# Patient Record
Sex: Male | Born: 1935 | Race: White | Hispanic: No | Smoking: Never smoker
Health system: Southern US, Community
[De-identification: ages and names within clinical notes are randomized; demographics above are authoritative.]

## PROBLEM LIST (undated history)

## (undated) DIAGNOSIS — E119 Type 2 diabetes mellitus without complications: Secondary | ICD-10-CM

## (undated) DIAGNOSIS — I10 Essential (primary) hypertension: Secondary | ICD-10-CM

## (undated) DIAGNOSIS — I639 Cerebral infarction, unspecified: Secondary | ICD-10-CM

## (undated) HISTORY — PX: LARYNGOSCOPY: SUR817

---

## 2001-09-07 ENCOUNTER — Emergency Department (HOSPITAL_COMMUNITY): Admission: EM | Admit: 2001-09-07 | Discharge: 2001-09-07 | Payer: Self-pay | Admitting: Emergency Medicine

## 2001-09-07 ENCOUNTER — Encounter: Payer: Self-pay | Admitting: Emergency Medicine

## 2003-01-13 ENCOUNTER — Encounter: Payer: Self-pay | Admitting: Emergency Medicine

## 2003-01-13 ENCOUNTER — Emergency Department (HOSPITAL_COMMUNITY): Admission: EM | Admit: 2003-01-13 | Discharge: 2003-01-13 | Payer: Self-pay | Admitting: Emergency Medicine

## 2009-02-17 ENCOUNTER — Inpatient Hospital Stay (HOSPITAL_COMMUNITY): Admission: EM | Admit: 2009-02-17 | Discharge: 2009-02-20 | Payer: Self-pay | Admitting: Emergency Medicine

## 2009-02-17 ENCOUNTER — Ambulatory Visit: Payer: Self-pay | Admitting: Cardiology

## 2009-02-18 ENCOUNTER — Encounter (INDEPENDENT_AMBULATORY_CARE_PROVIDER_SITE_OTHER): Payer: Self-pay | Admitting: Internal Medicine

## 2009-02-19 ENCOUNTER — Ambulatory Visit: Payer: Self-pay | Admitting: Physical Medicine & Rehabilitation

## 2009-02-19 ENCOUNTER — Encounter (INDEPENDENT_AMBULATORY_CARE_PROVIDER_SITE_OTHER): Payer: Self-pay | Admitting: Internal Medicine

## 2010-11-14 LAB — URINALYSIS, MICROSCOPIC ONLY
Glucose, UA: NEGATIVE mg/dL
Leukocytes, UA: NEGATIVE
Nitrite: NEGATIVE
Protein, ur: 100 mg/dL — AB
pH: 5.5 (ref 5.0–8.0)

## 2010-11-14 LAB — COMPREHENSIVE METABOLIC PANEL
Albumin: 3.6 g/dL (ref 3.5–5.2)
BUN: 15 mg/dL (ref 6–23)
Creatinine, Ser: 1.54 mg/dL — ABNORMAL HIGH (ref 0.4–1.5)
Potassium: 4.3 mEq/L (ref 3.5–5.1)
Total Protein: 6.8 g/dL (ref 6.0–8.3)

## 2010-11-14 LAB — LIPID PANEL
Cholesterol: 181 mg/dL (ref 0–200)
HDL: 30 mg/dL — ABNORMAL LOW (ref >39)
LDL Cholesterol: 135 mg/dL — ABNORMAL HIGH (ref 0–99)
Total CHOL/HDL Ratio: 6 RATIO

## 2010-11-14 LAB — BASIC METABOLIC PANEL
CO2: 29 mEq/L (ref 19–32)
Calcium: 8.7 mg/dL (ref 8.4–10.5)
Chloride: 106 mEq/L (ref 96–112)
Glucose, Bld: 114 mg/dL — ABNORMAL HIGH (ref 70–99)
Sodium: 140 mEq/L (ref 135–145)

## 2010-11-14 LAB — CBC
HCT: 47.9 % (ref 39.0–52.0)
HCT: 49.4 % (ref 39.0–52.0)
HCT: 51.3 % (ref 39.0–52.0)
Hemoglobin: 16.8 g/dL (ref 13.0–17.0)
MCHC: 34 g/dL (ref 30.0–36.0)
MCHC: 34.1 g/dL (ref 30.0–36.0)
MCV: 91.6 fL (ref 78.0–100.0)
MCV: 92 fL (ref 78.0–100.0)
Platelets: 104 10*3/uL — ABNORMAL LOW (ref 150–400)
Platelets: 109 10*3/uL — ABNORMAL LOW (ref 150–400)
RBC: 5.37 MIL/uL (ref 4.22–5.81)
RDW: 13.7 % (ref 11.5–15.5)
RDW: 13.9 % (ref 11.5–15.5)
RDW: 13.9 % (ref 11.5–15.5)
WBC: 8.5 10*3/uL (ref 4.0–10.5)

## 2010-11-14 LAB — URINE CULTURE: Colony Count: 40000

## 2010-11-14 LAB — DIFFERENTIAL
Eosinophils Absolute: 0.3 10*3/uL (ref 0.0–0.7)
Eosinophils Relative: 4 % (ref 0–5)
Lymphocytes Relative: 13 % (ref 12–46)
Lymphs Abs: 1.1 10*3/uL (ref 0.7–4.0)
Lymphs Abs: 1.9 10*3/uL (ref 0.7–4.0)
Monocytes Absolute: 0.8 10*3/uL (ref 0.1–1.0)
Monocytes Relative: 9 % (ref 3–12)
Neutro Abs: 6.6 10*3/uL (ref 1.7–7.7)

## 2010-11-14 LAB — APTT: aPTT: 32 seconds (ref 24–37)

## 2010-11-14 LAB — CK TOTAL AND CKMB (NOT AT ARMC)
Relative Index: 1.2 (ref 0.0–2.5)
Total CK: 205 U/L (ref 7–232)

## 2010-11-14 LAB — ETHANOL: Alcohol, Ethyl (B): <5 mg/dL (ref 0–10)

## 2010-11-14 LAB — TROPONIN I: Troponin I: 0.03 ng/mL (ref 0.00–0.06)

## 2010-12-21 NOTE — H&P (Signed)
Alex Ruiz, Alex Ruiz NO.:  [id number]   MEDICAL RECORD NO.:  [id number]          PATIENT TYPE:  INP   LOCATION:  3701                         FACILITY:  [facility]   PHYSICIAN:  Oswald Hillock, MD        DATE OF BIRTH:  1935/08/13   DATE OF ADMISSION:  02/17/2009  DATE OF DISCHARGE:                              HISTORY & PHYSICAL   CHIEF COMPLAINT:  Unsteady gait.   HISTORY OF PRESENT ILLNESS:  The patient is a 75 year old African  American male, assisted-living resident, who presents to the emergency  room with a 1-day history of unsteady gait and swaying to the left side.  Apparently, the patient has history of expressive aphasia since stroke  way back in late 44s.  He is currently living in assisted living here in  [place] and states that at baseline, he uses a cane only sparingly.  He has had a history of falls and the person accompanying him states  clearly that he was swaying to the left side and not his usual self.  The patient denies any chest pain, any shortness of breath, any  palpitations, any loss of consciousness, or any new weakness of any part  of the body.  No cough, fever, rigors reported by the patient either.   Past medical history is significant for:  1. Hypertension.  2. CVA with expressive aphasia.  3. Gout.   PAST SURGICAL HISTORY:  None.   CURRENT MEDICATIONS:  Aspirin, lisinopril, Norvasc.   ALLERGIES:  No known drug allergies.   SOCIAL HISTORY:  The patient has no recent history of alcohol, tobacco,  or drug use.  Lives in assisted living.  Needs help with some of his  ADLs and states that prior to this, he was able to ambulate without  help.   FAMILY HISTORY:  History of hypertension in the family.   REVIEW OF SYSTEMS:  An extensive review of systems was done and all  systems are negative except for the positive mentioned in the history of  present illness.   PHYSICAL EXAMINATION:  VITAL SIGNS:  On admission, pulse 96, blood  pressure 192/103, respiratory rate of 16, temperature 97.7, O2 sats of  99% on 2 liters nasal cannula.  GENERAL:  The patient is conscious, in no acute distress at the time of  this interview, and has significant expressive aphasia.  HEENT:  No  scleral icterus, no pallor.  Ears, negative.  Poor dental hygiene.  NECK:  Supple.  No lymphadenopathy, no JVD, no carotid bruit.  CHEST:  Breath sounds heard bilaterally.  No added sounds.  CVS:  S1 and S2 plus regular.  No gallop or rub.  ABDOMEN:  Soft, nontender.  Bowel sounds present.  EXTREMITIES:  No cyanosis, clubbing, or edema.  NEUROLOGIC:  The patient as mentioned above has expressive aphasia;  otherwise, no apparent cranial palsy.  Motor examination, power grade  5/5 all over, normal reflexes.  Plantars are bilaterally downgoing.  The  patient has difficulty following the finger-nose and heel-shin testing  and has significant ataxia.   LABORATORY DATA:  His CT  head revealed no acute intracranial findings,  left temporoparietal encephalomalacia changes and atrophy.  Chest x-ray  showed questionable irregular nodular opacity medially at the right lung  base.  No definite infiltrate or effusion.  Alcohol level was less than  5.  Sodium 141, potassium 4.3, chloride 106, CO2 of 28, glucose 111, BUN  15, creatinine 1.54.  His total bilirubin was 1.3.  PT was 15.2, INR  1.2.  WBC count 8.6, hemoglobin 17.4, hematocrit 51.3, platelet count of  109.   IMPRESSION AND PLAN:  This is a case of a 75 year old African American  male with known history of hypertension, cerebrovascular accident with  expressive aphasia and gout who presents with new symptoms of unsteady  gait.  1. Unsteady gait, need to rule out new cerebrovascular accident.  We      will get an MRI of his brain and follow up with the results.  I      will also initiate a new stroke workup, as the patient has been      worked up in the last few years.  His last CVA was about 9  years      back.  This will include baseline ESR, CRP, homocysteine level,      fasting lipid panel, hemoglobin A1c, and TSH, bilateral carotid and      transcranial Dopplers.  We will also get a baseline EKG and set of      cardiac enzymes and follow up with the results.  2. Hypertension.  The patient's blood pressure was elevated on      admission, it has improved significantly.  For now, we will      continue him on his current medications.  Important to mention that      the patient will be continued on his daily dose of aspirin.  3. Gout.  No medications documented.  We will check a uric acid level      and monitor.  4. Nodular opacity on chest x-ray.  We do not have any prior chest x-      rays available.  We will get a regular CT of the chest and follow      up with the results.  5. Deep venous thrombosis/gastrointestinal prophylaxis.  Protonix and      subcu heparin.      Oswald Hillock, MD  Electronically Signed     BA/MEDQ  D:  02/17/2009  T:  02/18/2009  Job:  [id number]   cc:   at the assisted living Primary care physician

## 2010-12-21 NOTE — Discharge Summary (Signed)
NAMEENDY, EASTERLY NO.:  [id number]   MEDICAL RECORD NO.:  [id number]          PATIENT TYPE:  INP   LOCATION:  3701                         FACILITY:  [facility]   PHYSICIAN:  Elliot Cousin, M.D.    DATE OF BIRTH:  29-Oct-1935   DATE OF ADMISSION:  02/17/2009  DATE OF DISCHARGE:  02/20/2009                               DISCHARGE SUMMARY   DISCHARGE DIAGNOSES:  1. Acute left brain stroke (small area of acute infarction in the left      medial occipital lobe per MRI of the brain on February 18, 2009).  2. Unsteady gait/gait disorder and mild right-sided hemiparesis      secondary to the acute stroke.  3. History of previous left brain stroke with chronic expressive      aphasia.  4. Malignant hypertension.  5. Acute renal insufficiency, resolved with IV fluids.  6. Thrombocytopenia, etiology unclear.  However, the patient's      platelet count remained stable ranging from 100-110.  Vitamin B12      and TSH was were both within normal limits.  7. Hyperlipidemia.   DISCHARGE MEDICATIONS:  1. Norvasc 10 mg daily.  2. Plavix 75 mg daily.  3. Lisinopril 10 mg daily.  4. Metoprolol 25 mg b.i.d.  5. Protonix 40 mg daily.  6. Zocor 10 mg every night.   DISCHARGE DISPOSITION:  The patient is currently stable and in improved  condition.  The plan is to discharge him to [facility] skilled nursing  facility today.   CONSULTATIONS:  Rehabilitation physician, Dr. Wynn Banker.   PROCEDURE PERFORMED:  1. Carotid duplex study on February 19, 2009.  The results revealed no ICA      stenosis.  Vertebral flow antegrade.  2. Transcranial Doppler.  Results are currently pending.  3. CT scan of the chest without contrast on February 18, 2009.  The      results revealed no evidence of pulmonary nodules.  Low-lung      volumes.  4. MRI of the brain with and without contrast on February 18, 2009.  The      results revealed small area of acute infarction in the left medial      occipital lobe.   Advanced atrophy and advanced chronic ischemic      changes.  Encephalomalacia in the left temporal lobe related to      prior craniotomy.  Multiple areas of chronic hemorrhage, most      likely due to chronic hypertension.  5. Two-D echocardiogram on February 18, 2009.  The results revealed a      limited exam.  Left ventricle cavity size was normal.  Images were      inadequate for LV for motion assessment.  Right ventricle function      is probably normal,  as interpreted by cardiologist, Dr. Willa Rough.   HISTORY OF PRESENT ILLNESS:  The patient is a 75 year old man with a  medical history significant for a previous left brain stroke, chronic  expressive aphasia, hypertension, and gout, who presented to the  emergency department on  February 17, 2009, with a chief complaint of an  unsteady gait.  When the patient was evaluated in the emergency  department, he was noted to be hypertensive with a blood pressure  192/103.  His respiratory rate and oxygen saturations were within normal  limits.  The CT scan of the head as ordered by the emergency department  physician revealed no acute intracranial findings.  However, there was  evidence of a left temporal parietal encephalomalacia and atrophy.  His  chest x-ray revealed a questionable irregular nodular opacity at the  right lung base.  However, there were no definite infiltrates or  effusions.  His EKG revealed normal sinus rhythm with a heart rate of 92  beats per minute and no ST or T-wave abnormalities.  The patient was  admitted for further evaluation and management.   For additional details, please see the dictated history and physical.   HOSPITAL COURSE:  1. Acute left brain stroke, history of prior left brain stroke, right-      sided hemiparesis, chronic expressive aphasia, and hyperlipidemia.      The patient had been treated with aspirin prior to this      hospitalization.  Apparently, aspirin was continued at the time of       the initial hospital assessment, pending further evaluation for a      new CVA.  Both Norvasc and lisinopril were continued for blood      pressure control.  His swallowing was evaluated in the emergency      department and he passed.  For further evaluation, a number of      studies were ordered including an MRI of the brain, carotid Doppler      study, transcranial Dopplers, 2-D echocardiogram, and a number of      laboratory studies.  The results of the MRI of the brain are above.      However, in essence, it did reveal an acute infarction in the left      medial occipital lobe.  The results of the transcranial Dopplers      are pending.  The carotid Doppler duplex study revealed no ICA      stenosis.  The results of the 2-D echocardiogram were limited.      However, it appeared that the patient's left ventricular function      and right ventricular functions were within normal limits; there      was no obvious evidence of a cardiac embolic source.  His cardiac      enzymes were all within normal limits.  His TSH was within normal      limits at 0.645.  His sed rate was 3.  C-reactive protein was      actually low at 0.5.  His blood homocysteine level was within      normal limits at 10.9.  His fasting lipid profile revealed a total      cholesterol of 181, HDL cholesterol of 30, LDL cholesterol of 135,      and triglycerides of 82.  The patient was started on a small dose      of Zocor daily.  His hemoglobin A1c was within normal limits at      5.2.  His vitamin B12 was within normal limits at 510.  RPR was      nonreactive.  His urinalysis was free of WBCs.  The patient was      evaluated by the physical therapist.  She recommended further  strengthening in a skilled environment or at the inpatient      rehabilitation unit at [facility].  Rehabilitation      physician Dr. Wynn Banker was consulted, and at the time of his      assessment, it was unclear whether or not the  patient had family      members who could stay with him 24 hours per day following a      potential discharge from the inpatient rehabilitation unit.  When      it was confirmed that he would not have 24-hour assistance at home,      skilled nursing facility placement was pursued.  The patient has      acquired a bed at [facility] today.  He is medically stable for      hospital discharge.  In light of the new acute stroke and evidence      of chronic microhemorrhages, the aspirin was discontinued, and the      patient was started on Plavix instead.  2. Malignant hypertension.  The patient was restarted on Norvasc and      lisinopril.  His blood pressure remained elevated.  There was      caution taken in decreasing his blood pressure too rapidly in the      setting of an acute stroke.  However, metoprolol 25 mg b.i.d. was      added.  Over the past 24 hours, his systolic blood pressure has      been ranging in the 160s, and his diastolic blood pressure has been      ranging in the 80s to 90s.  3. Acute renal insufficiency.  At the time of the initial hospital      assessment, the patient's creatinine was 1.54.  He was started on      gentle IV fluids.  Prior to hospital discharge, his creatinine      improved to 1.10.  4. Thrombocytopenia.  At the time of the initial hospital assessment,      the patient's platelet count was 109.  Prior to hospital discharge,      it was 104.  The etiology of his thrombocytopenia is unknown.      However, it could be secondary to antiplatelet therapy.  There was      no evidence of acute bleeding during the hospitalization.  As      above, for evaluation, vitamin B12 and TSH were ordered.  Both were      within normal limits.  His platelet count      will need to continue to be monitored on ongoing antiplatelet      therapy.  5. Reported questionable nodular opacities seen on chest x-ray.      Because of this questionable finding on the chest x-ray, a  CT scan      of the chest was ordered for evaluation.  The CT scan revealed no      pulmonary nodules.      Elliot Cousin, M.D.  Electronically Signed     DF/MEDQ  D:  02/20/2009  T:  02/20/2009  Job:  [id number]

## 2014-04-28 ENCOUNTER — Emergency Department (HOSPITAL_COMMUNITY)
Admission: EM | Admit: 2014-04-28 | Discharge: 2014-04-28 | Disposition: A | Discharge status: Home / Self Care | Payer: Medicare Other | Attending: Emergency Medicine | Admitting: Emergency Medicine

## 2014-04-28 ENCOUNTER — Emergency Department (HOSPITAL_COMMUNITY): Payer: Medicare Other

## 2014-04-28 ENCOUNTER — Encounter (HOSPITAL_COMMUNITY): Payer: Self-pay | Admitting: Emergency Medicine

## 2014-04-28 DIAGNOSIS — F0390 Unspecified dementia without behavioral disturbance: Secondary | ICD-10-CM | POA: Insufficient documentation

## 2014-04-28 DIAGNOSIS — Y9389 Activity, other specified: Secondary | ICD-10-CM | POA: Insufficient documentation

## 2014-04-28 DIAGNOSIS — E119 Type 2 diabetes mellitus without complications: Secondary | ICD-10-CM | POA: Insufficient documentation

## 2014-04-28 DIAGNOSIS — E669 Obesity, unspecified: Secondary | ICD-10-CM | POA: Diagnosis not present

## 2014-04-28 DIAGNOSIS — Z794 Long term (current) use of insulin: Secondary | ICD-10-CM | POA: Insufficient documentation

## 2014-04-28 DIAGNOSIS — Z7902 Long term (current) use of antithrombotics/antiplatelets: Secondary | ICD-10-CM | POA: Insufficient documentation

## 2014-04-28 DIAGNOSIS — W19XXXA Unspecified fall, initial encounter: Secondary | ICD-10-CM

## 2014-04-28 DIAGNOSIS — Z79899 Other long term (current) drug therapy: Secondary | ICD-10-CM | POA: Insufficient documentation

## 2014-04-28 DIAGNOSIS — S0990XA Unspecified injury of head, initial encounter: Secondary | ICD-10-CM | POA: Diagnosis present

## 2014-04-28 DIAGNOSIS — IMO0002 Reserved for concepts with insufficient information to code with codable children: Secondary | ICD-10-CM | POA: Diagnosis not present

## 2014-04-28 DIAGNOSIS — Z8673 Personal history of transient ischemic attack (TIA), and cerebral infarction without residual deficits: Secondary | ICD-10-CM | POA: Diagnosis not present

## 2014-04-28 DIAGNOSIS — W1809XA Striking against other object with subsequent fall, initial encounter: Secondary | ICD-10-CM | POA: Diagnosis not present

## 2014-04-28 DIAGNOSIS — I10 Essential (primary) hypertension: Secondary | ICD-10-CM | POA: Diagnosis not present

## 2014-04-28 DIAGNOSIS — Y9289 Other specified places as the place of occurrence of the external cause: Secondary | ICD-10-CM | POA: Insufficient documentation

## 2014-04-28 DIAGNOSIS — T148XXA Other injury of unspecified body region, initial encounter: Secondary | ICD-10-CM

## 2014-04-28 HISTORY — DX: Cerebral infarction, unspecified: I63.9

## 2014-04-28 HISTORY — DX: Type 2 diabetes mellitus without complications: E11.9

## 2014-04-28 HISTORY — DX: Essential (primary) hypertension: I10

## 2014-04-28 NOTE — ED Notes (Signed)
Pt placed into gown and on monitor upon arrival to room. Pt monitored by blood pressure, pulse ox, and 5 lead.  

## 2014-04-28 NOTE — ED Notes (Signed)
Pt remains off of monitor due to monitor causing agitation RN, Elon Jester made aware.

## 2014-04-28 NOTE — ED Notes (Signed)
PTAR at bedside 

## 2014-04-28 NOTE — ED Notes (Signed)
Pt remains monitored by blood pressure and pulse ox.  

## 2014-04-28 NOTE — ED Notes (Signed)
Fell from standing position onto carpeted floor witnessed by staff at nursing home no loc no co abrasion to forehead "normal acting' per report

## 2014-04-28 NOTE — Discharge Instructions (Signed)
Followup with primary care physician. Monitor patient for neurologic changes from his baseline due to the fact that patient has been taking Plavix. Patient's CT scan was negative for any acute injury.    Fall Prevention and Home Safety Falls cause injuries and can affect all age groups. It is possible to prevent falls.  HOW TO PREVENT FALLS  Wear shoes with rubber soles that do not have an opening for your toes.  Keep the inside and outside of your house well lit.  Use night lights throughout your home.  Remove clutter from floors.  Clean up floor spills.  Remove throw rugs or fasten them to the floor with carpet tape.  Do not place electrical cords across pathways.  Put grab bars by your tub, shower, and toilet. Do not use towel bars as grab bars.  Put handrails on both sides of the stairway. Fix loose handrails.  Do not climb on stools or stepladders, if possible.  Do not wax your floors.  Repair uneven or unsafe sidewalks, walkways, or stairs.  Keep items you use a lot within reach.  Be aware of pets.  Keep emergency numbers next to the telephone.  Put smoke detectors in your home and near bedrooms. Ask your doctor what other things you can do to prevent falls. Document Released: 05/21/2009 Document Revised: 01/24/2012 Document Reviewed: 10/25/2011 [facility] Patient Information 2015 [place], [place]. This information is not intended to replace advice given to you by your health care provider. Make sure you discuss any questions you have with your health care provider.   Abrasion An abrasion is a cut or scrape of the skin. Abrasions do not extend through all layers of the skin and most heal within 10 days. It is important to care for your abrasion properly to prevent infection. CAUSES  Most abrasions are caused by falling on, or gliding across, the ground or other surface. When your skin rubs on something, the outer and inner layer of skin rubs off, causing an  abrasion. DIAGNOSIS  Your caregiver will be able to diagnose an abrasion during a physical exam.  TREATMENT  Your treatment depends on how large and deep the abrasion is. Generally, your abrasion will be cleaned with water and a mild soap to remove any dirt or debris. An antibiotic ointment may be put over the abrasion to prevent an infection. A bandage (dressing) may be wrapped around the abrasion to keep it from getting dirty.  You may need a tetanus shot if:  You cannot remember when you had your last tetanus shot.  You have never had a tetanus shot.  The injury broke your skin. If you get a tetanus shot, your arm may swell, get red, and feel warm to the touch. This is common and not a problem. If you need a tetanus shot and you choose not to have one, there is a rare chance of getting tetanus. Sickness from tetanus can be serious.  HOME CARE INSTRUCTIONS   If a dressing was applied, change it at least once a day or as directed by your caregiver. If the bandage sticks, soak it off with warm water.   Wash the area with water and a mild soap to remove all the ointment 2 times a day. Rinse off the soap and pat the area dry with a clean towel.   Reapply any ointment as directed by your caregiver. This will help prevent infection and keep the bandage from sticking. Use gauze over the wound and under the  dressing to help keep the bandage from sticking.   Change your dressing right away if it becomes wet or dirty.   Only take over-the-counter or prescription medicines for pain, discomfort, or fever as directed by your caregiver.   Follow up with your caregiver within 24-48 hours for a wound check, or as directed. If you were not given a wound-check appointment, look closely at your abrasion for redness, swelling, or pus. These are signs of infection. SEEK IMMEDIATE MEDICAL CARE IF:   You have increasing pain in the wound.   You have redness, swelling, or tenderness around the wound.    You have pus coming from the wound.   You have a fever or persistent symptoms for more than 2-3 days.  You have a fever and your symptoms suddenly get worse.  You have a bad smell coming from the wound or dressing.  MAKE SURE YOU:   Understand these instructions.  Will watch your condition.  Will get help right away if you are not doing well or get worse. Document Released: 05/04/2005 Document Revised: 07/11/2012 Document Reviewed: 06/28/2011 [facility] Patient Information 2015 [place], [place]. This information is not intended to replace advice given to you by your health care provider. Make sure you discuss any questions you have with your health care provider.

## 2014-04-28 NOTE — ED Notes (Signed)
To ct

## 2014-04-28 NOTE — ED Notes (Signed)
This RN asked secretary French Ana to page PTAR again.

## 2014-04-28 NOTE — ED Notes (Signed)
Pt remians resting on cart in hallway at nurses station waiting for ptar pt without c/o

## 2014-04-28 NOTE — ED Provider Notes (Signed)
CSN: [id number]     Arrival date & time 04/28/14  1313 History   First MD Initiated Contact with Patient 04/28/14 1340     Chief Complaint  Patient presents with  . Fall     (Consider location/radiation/quality/duration/timing/severity/associated sxs/prior Treatment) HPI Mr. Alex Ruiz is a 78 year old male with past medical history of CVA, dementia who presents to the ER by EMS after a fall. Per nursing home staff patient was walking along a carpeted hallway, stumbled, then fell face forward hitting his head on the carpet. Nursing home staff denies loss of consciousness with patient, and states that he has been acting appropriately for his baseline mentally. Patient denies having any pain when I speak to him, and there is an obvious minor abrasion to the middle of his fore head. Patient able to make eye contact, obeys simple verbal commands, however cannot answer any questions appropriately.  Past Medical History  Diagnosis Date  . Hypertension   . Diabetes mellitus without complication   . Stroke    History reviewed. No pertinent past surgical history. History reviewed. No pertinent family history. History  Substance Use Topics  . Smoking status: Unknown If Ever Smoked  . Smokeless tobacco: Not on file  . Alcohol Use: No    Review of Systems  Unable to perform ROS: Dementia      Allergies  Review of patient's allergies indicates no known allergies.  Home Medications   Prior to Admission medications   Medication Sig Start Date End Date Taking? Authorizing Provider  amLODipine (NORVASC) 10 MG tablet Take 10 mg by mouth daily.   Yes Historical Provider, MD  cholecalciferol (VITAMIN D) 1000 UNITS tablet Take 1,000 Units by mouth daily.   Yes Historical Provider, MD  clopidogrel (PLAVIX) 75 MG tablet Take 75 mg by mouth daily.   Yes Historical Provider, MD  diphenhydrAMINE (BENADRYL) 25 MG tablet Take 25 mg by mouth every 6 (six) hours as needed for itching.   Yes Historical  Provider, MD  fluticasone (FLONASE) 50 MCG/ACT nasal spray Place 2 sprays into both nostrils at bedtime.   Yes Historical Provider, MD  insulin aspart (NOVOLOG) 100 UNIT/ML injection Inject 0-6 Units into the skin daily as needed for high blood sugar. According to sliding scale: Hold for <120; 120-150 = 3 units; >150 = 6 units   Yes Historical Provider, MD  insulin glargine (LANTUS) 100 UNIT/ML injection Inject 3 Units into the skin every morning.   Yes Historical Provider, MD  lisinopril (PRINIVIL,ZESTRIL) 20 MG tablet Take 20 mg by mouth daily.   Yes Historical Provider, MD  magnesium hydroxide (MILK OF MAGNESIA) 400 MG/5ML suspension Take 30 mLs by mouth every 4 (four) hours as needed for mild constipation.   Yes Historical Provider, MD  Melatonin 3 MG TABS Take 3 mg by mouth at bedtime. For insomnia   Yes Historical Provider, MD  metoprolol tartrate (LOPRESSOR) 25 MG tablet Take 25 mg by mouth 2 (two) times daily.   Yes Historical Provider, MD  polyethylene glycol (MIRALAX / GLYCOLAX) packet Take 17 g by mouth daily.   Yes Historical Provider, MD  simvastatin (ZOCOR) 10 MG tablet Take 10 mg by mouth at bedtime.   Yes Historical Provider, MD   BP 138/60  Pulse 53  Temp(Src) 98.2 F (36.8 C) (Oral)  Resp 18  Ht  (1.727 m)  Wt 160 lb (72.576 kg)  BMI 24.33 kg/m2  SpO2 95% Physical Exam  Constitutional: He appears well-developed and well-nourished. No distress.  HENT:  Head: Normocephalic and atraumatic.  Mouth/Throat: Oropharynx is clear and moist. No oropharyngeal exudate.  Eyes: Right eye exhibits no discharge. Left eye exhibits no discharge. No scleral icterus.  Neck: Normal range of motion.  Cardiovascular: Normal rate, regular rhythm and normal heart sounds.   No murmur heard. Pulmonary/Chest: Effort normal and breath sounds normal. No respiratory distress.  Abdominal: Soft. There is no tenderness.  Musculoskeletal: Normal range of motion. He exhibits no edema and no  tenderness.  Neurological: He is alert. No cranial nerve deficit. Coordination normal.  Patient is somewhat uncooperative with exam due to his dementia, however can follow objects in front of him with EOMs intact, can hold his arms out in front of him when asked, can grip fingers when asked with 5 out of 5 motor strength in all muscle groups of upper and lower extremities. No obvious facial droop, patient able to open mouth, straight out tongue  Skin: Skin is warm and dry. No rash noted. He is not diaphoretic.  Psychiatric: He has a normal mood and affect.    ED Course  Procedures (including critical care time) Labs Review Labs Reviewed - No data to display  Imaging Review No results found.   EKG Interpretation None      MDM   Final diagnoses:  Abrasion  Fall from standing, initial encounter    78 year old male fell from a standing position onto a carpeted floor. Patient on Plavix. Per report from nursing home staff, patient is acting appropriately for his normal mentation. Patient with history of vascular dementia. Plan to rule out CVA with CT non-con.   Patient remains at baseline. Patient maintains eye contact, however cannot answer questions appropriately.   CT head with impression: Atrophy with multiple prior infarcts and areas of small vessel disease. 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 No mass, hemorrhage, acute infarct, or extra-axial fluid collection. No acute appearing infarct.  With patient's fall being witnessed by nursing staff, and patient's behavior consistent with his baseline as reported by nursing home staff, patient does not require further workup as to the cause of his fall, or investigation as to whether it was caused by syncope. We will discharge patient back to the facility. Return precautions communicated with facility by discharge paperwork.  BP 138/60  Pulse 53  Temp(Src) 98.2 F (36.8 C) (Oral)  Resp 18  Ht  (1.727 m)  Wt 160  lb (72.576 kg)  BMI 24.33 kg/m2  SpO2 95%   Signed,  Ladona Mow, PA-C 5:02 PM   This patient seen and discussed with Dr. Pricilla Loveless, M.D.    Monte Fantasia, PA-C 04/30/14 1702

## 2014-05-01 NOTE — ED Provider Notes (Signed)
Medical screening examination/treatment/procedure(s) were conducted as a shared visit with non-physician practitioner(s) and myself.  I personally evaluated the patient during the encounter.   EKG Interpretation None       Patient with a mechanical fall and hit his for head. CT scan is negative. Stable for discharge back to his facility  Audree Camel, MD 05/01/14 2312

## 2014-10-19 ENCOUNTER — Emergency Department (HOSPITAL_COMMUNITY): Payer: Medicare Other

## 2014-10-19 ENCOUNTER — Emergency Department (HOSPITAL_COMMUNITY)
Admission: EM | Admit: 2014-10-19 | Discharge: 2014-10-19 | Disposition: A | Discharge status: Home / Self Care | Payer: Medicare Other | Attending: Emergency Medicine | Admitting: Emergency Medicine

## 2014-10-19 ENCOUNTER — Encounter (HOSPITAL_COMMUNITY): Payer: Self-pay | Admitting: Emergency Medicine

## 2014-10-19 DIAGNOSIS — E119 Type 2 diabetes mellitus without complications: Secondary | ICD-10-CM | POA: Diagnosis not present

## 2014-10-19 DIAGNOSIS — W01198A Fall on same level from slipping, tripping and stumbling with subsequent striking against other object, initial encounter: Secondary | ICD-10-CM | POA: Insufficient documentation

## 2014-10-19 DIAGNOSIS — Y998 Other external cause status: Secondary | ICD-10-CM | POA: Insufficient documentation

## 2014-10-19 DIAGNOSIS — I10 Essential (primary) hypertension: Secondary | ICD-10-CM | POA: Insufficient documentation

## 2014-10-19 DIAGNOSIS — W050XXA Fall from non-moving wheelchair, initial encounter: Secondary | ICD-10-CM | POA: Diagnosis not present

## 2014-10-19 DIAGNOSIS — W19XXXA Unspecified fall, initial encounter: Secondary | ICD-10-CM

## 2014-10-19 DIAGNOSIS — S01111A Laceration without foreign body of right eyelid and periocular area, initial encounter: Secondary | ICD-10-CM | POA: Insufficient documentation

## 2014-10-19 DIAGNOSIS — S0181XA Laceration without foreign body of other part of head, initial encounter: Secondary | ICD-10-CM

## 2014-10-19 DIAGNOSIS — Y9289 Other specified places as the place of occurrence of the external cause: Secondary | ICD-10-CM | POA: Diagnosis not present

## 2014-10-19 DIAGNOSIS — Z79899 Other long term (current) drug therapy: Secondary | ICD-10-CM | POA: Diagnosis not present

## 2014-10-19 DIAGNOSIS — S0990XA Unspecified injury of head, initial encounter: Secondary | ICD-10-CM | POA: Diagnosis present

## 2014-10-19 DIAGNOSIS — Z8673 Personal history of transient ischemic attack (TIA), and cerebral infarction without residual deficits: Secondary | ICD-10-CM | POA: Diagnosis not present

## 2014-10-19 DIAGNOSIS — Z794 Long term (current) use of insulin: Secondary | ICD-10-CM | POA: Diagnosis not present

## 2014-10-19 DIAGNOSIS — Y9389 Activity, other specified: Secondary | ICD-10-CM | POA: Diagnosis not present

## 2014-10-19 MED ORDER — LIDOCAINE-EPINEPHRINE 2 %-1:100000 IJ SOLN
10.0000 mL | Freq: Once | INTRAMUSCULAR | Status: AC
  Administered 2014-10-19: 10 mL via INTRADERMAL
  Filled 2014-10-19: qty 1

## 2014-10-19 NOTE — Discharge Instructions (Signed)
Facial Laceration ° A facial laceration is a cut on the face. These injuries can be painful and cause bleeding. Lacerations usually heal quickly, but they need special care to reduce scarring. °DIAGNOSIS  °Your health care provider will take a medical history, ask for details about how the injury occurred, and examine the wound to determine how deep the cut is. °TREATMENT  °Some facial lacerations may not require closure. Others may not be able to be closed because of an increased risk of infection. The risk of infection and the chance for successful closure will depend on various factors, including the amount of time since the injury occurred. °The wound may be cleaned to help prevent infection. If closure is appropriate, pain medicines may be given if needed. Your health care provider will use stitches (sutures), wound glue (adhesive), or skin adhesive strips to repair the laceration. These tools bring the skin edges together to allow for faster healing and a better cosmetic outcome. If needed, you may also be given a tetanus shot. °HOME CARE INSTRUCTIONS °· Only take over-the-counter or prescription medicines as directed by your health care provider. °· Follow your health care provider's instructions for wound care. These instructions will vary depending on the technique used for closing the wound. °For Sutures: °· Keep the wound clean and dry.   °· If you were given a bandage (dressing), you should change it at least once a day. Also change the dressing if it becomes wet or dirty, or as directed by your health care provider.   °· Wash the wound with soap and water 2 times a day. Rinse the wound off with water to remove all soap. Pat the wound dry with a clean towel.   °· After cleaning, apply a thin layer of the antibiotic ointment recommended by your health care provider. This will help prevent infection and keep the dressing from sticking.   °· You may shower as usual after the first 24 hours. Do not soak the  wound in water until the sutures are removed.   °· Get your sutures removed as directed by your health care provider. With facial lacerations, sutures should usually be taken out after 4-5 days to avoid stitch marks.   °· Wait a few days after your sutures are removed before applying any makeup. °For Skin Adhesive Strips: °· Keep the wound clean and dry.   °· Do not get the skin adhesive strips wet. You may bathe carefully, using caution to keep the wound dry.   °· If the wound gets wet, pat it dry with a clean towel.   °· Skin adhesive strips will fall off on their own. You may trim the strips as the wound heals. Do not remove skin adhesive strips that are still stuck to the wound. They will fall off in time.   °For Wound Adhesive: °· You may briefly wet your wound in the shower or bath. Do not soak or scrub the wound. Do not swim. Avoid periods of heavy sweating until the skin adhesive has fallen off on its own. After showering or bathing, gently pat the wound dry with a clean towel.   °· Do not apply liquid medicine, cream medicine, ointment medicine, or makeup to your wound while the skin adhesive is in place. This may loosen the film before your wound is healed.   °· If a dressing is placed over the wound, be careful not to apply tape directly over the skin adhesive. This may cause the adhesive to be pulled off before the wound is healed.   °· Avoid   prolonged exposure to sunlight or tanning lamps while the skin adhesive is in place. °· The skin adhesive will usually remain in place for 5-10 days, then naturally fall off the skin. Do not pick at the adhesive film.   °After Healing: °Once the wound has healed, cover the wound with sunscreen during the day for 1 full year. This can help minimize scarring. Exposure to ultraviolet light in the first year will darken the scar. It can take 1-2 years for the scar to lose its redness and to heal completely.  °SEEK IMMEDIATE MEDICAL CARE IF: °· You have redness, pain, or  swelling around the wound.   °· You see a yellowish-white fluid (pus) coming from the wound.   °· You have chills or a fever.   °MAKE SURE YOU: °· Understand these instructions. °· Will watch your condition. °· Will get help right away if you are not doing well or get worse. °Document Released: 09/01/2004 Document Revised: 05/15/2013 Document Reviewed: 03/07/2013 °[employer]® Patient Information ©2015 [employer]. This information is not intended to replace advice given to you by your health care provider. Make sure you discuss any questions you have with your health care provider. ° °Fall Prevention and Home Safety °Falls cause injuries and can affect all age groups. It is possible to use preventive measures to significantly decrease the likelihood of falls. There are many simple measures which can make your home safer and prevent falls. °OUTDOORS °· Repair cracks and edges of walkways and driveways. °· Remove high doorway thresholds. °· Trim shrubbery on the main path into your home. °· Have good outside lighting. °· Clear walkways of tools, rocks, debris, and clutter. °· Check that handrails are not broken and are securely fastened. Both sides of steps should have handrails. °· Have leaves, snow, and ice cleared regularly. °· Use sand or salt on walkways during winter months. °· In the garage, clean up grease or oil spills. °BATHROOM °· Install night lights. °· Install grab bars by the toilet and in the tub and shower. °· Use non-skid mats or decals in the tub or shower. °· Place a plastic non-slip stool in the shower to sit on, if needed. °· Keep floors dry and clean up all water on the floor immediately. °· Remove soap buildup in the tub or shower on a regular basis. °· Secure bath mats with non-slip, double-sided rug tape. °· Remove throw rugs and tripping hazards from the floors. °BEDROOMS °· Install night lights. °· Make sure a bedside light is easy to reach. °· Do not use oversized bedding. °· Keep a  telephone by your bedside. °· Have a firm chair with side arms to use for getting dressed. °· Remove throw rugs and tripping hazards from the floor. °KITCHEN °· Keep handles on pots and pans turned toward the center of the stove. Use back burners when possible. °· Clean up spills quickly and allow time for drying. °· Avoid walking on wet floors. °· Avoid hot utensils and knives. °· Position shelves so they are not too high or low. °· Place commonly used objects within easy reach. °· If necessary, use a sturdy step stool with a grab bar when reaching. °· Keep electrical cables out of the way. °· Do not use floor polish or wax that makes floors slippery. If you must use wax, use non-skid floor wax. °· Remove throw rugs and tripping hazards from the floor. °STAIRWAYS °· Never leave objects on stairs. °· Place handrails on both sides of stairways and use them.   Fix any loose handrails. Make sure handrails on both sides of the stairways are as long as the stairs. °· Check carpeting to make sure it is firmly attached along stairs. Make repairs to worn or loose carpet promptly. °· Avoid placing throw rugs at the top or bottom of stairways, or properly secure the rug with carpet tape to prevent slippage. Get rid of throw rugs, if possible. °· Have an electrician put in a light switch at the top and bottom of the stairs. °OTHER FALL PREVENTION TIPS °· Wear low-heel or rubber-soled shoes that are supportive and fit well. Wear closed toe shoes. °· When using a stepladder, make sure it is fully opened and both spreaders are firmly locked. Do not climb a closed stepladder. °· Add color or contrast paint or tape to grab bars and handrails in your home. Place contrasting color strips on first and last steps. °· Learn and use mobility aids as needed. Install an electrical emergency response system. °· Turn on lights to avoid dark areas. Replace light bulbs that burn out immediately. Get light switches that glow. °· Arrange furniture  to create clear pathways. Keep furniture in the same place. °· Firmly attach carpet with non-skid or double-sided tape. °· Eliminate uneven floor surfaces. °· Select a carpet pattern that does not visually hide the edge of steps. °· Be aware of all pets. °OTHER HOME SAFETY TIPS °· Set the water temperature for 120° F (48.8° C). °· Keep emergency numbers on or near the telephone. °· Keep smoke detectors on every level of the home and near sleeping areas. °Document Released: 07/15/2002 Document Revised: 01/24/2012 Document Reviewed: 10/14/2011 °[employer]® Patient Information ©2015 [employer]. This information is not intended to replace advice given to you by your health care provider. Make sure you discuss any questions you have with your health care provider. ° °

## 2014-10-19 NOTE — ED Notes (Addendum)
Pt from [facility] via [facility] after fall-Per EMS facility reported that pt was found in floor after falling out of wheelchair hitting head and receiving head lac to right eyebrow. Staff did not know how long pt was down, picked him up and put in the bed. Pt is at his baseline and non-verbal. Pt is on Plavix. Neck brace placed on scene by EMS

## 2014-10-19 NOTE — ED Provider Notes (Signed)
CSN: [id number]     Arrival date & time 10/19/14  1729 History   First MD Initiated Contact with Patient 10/19/14 1738     Chief Complaint  Patient presents with  . Fall  . Head Injury     HPI Patient presents to emergency room after suffering a fall.  Has laceration above the right eyebrow. Pt from [facility] via [facility] after fall-Per EMS facility reported that pt was found in floor after falling out of wheelchair hitting head and receiving head lac to right eyebrow. Staff did not know how long pt was down, picked him up and put in the bed. Pt is at his baseline and non-verbal. Pt is on Plavix. Neck brace placed on scene by EMS Past Medical History  Diagnosis Date  . Hypertension   . Diabetes mellitus without complication   . Stroke    History reviewed. No pertinent past surgical history. No family history on file. History  Substance Use Topics  . Smoking status: Unknown If Ever Smoked  . Smokeless tobacco: Not on file  . Alcohol Use: No    Review of Systems  Unable to perform ROS: Dementia      Allergies  Review of patient's allergies indicates no known allergies.  Home Medications   Prior to Admission medications   Medication Sig Start Date End Date Taking? Authorizing Provider  amLODipine (NORVASC) 10 MG tablet Take 10 mg by mouth daily.   Yes Historical Provider, MD  cetirizine (ZYRTEC) 10 MG tablet Take 10 mg by mouth at bedtime.    Yes Historical Provider, MD  cholecalciferol (VITAMIN D) 1000 UNITS tablet Take 1,000 Units by mouth daily.   Yes Historical Provider, MD  clopidogrel (PLAVIX) 75 MG tablet Take 75 mg by mouth daily.   Yes Historical Provider, MD  insulin aspart (NOVOLOG) 100 UNIT/ML injection Inject 0-6 Units into the skin daily as needed for high blood sugar. According to sliding scale: Hold for <120; 120-150 = 3 units; >150 = 6 units   Yes Historical Provider, MD  Insulin Detemir (LEVEMIR) 100 UNIT/ML Pen Inject 3 Units into the skin daily with  breakfast.   Yes Historical Provider, MD  lisinopril (PRINIVIL,ZESTRIL) 20 MG tablet Take 20 mg by mouth daily.   Yes Historical Provider, MD  Melatonin 3 MG TABS Take 3 mg by mouth at bedtime. For insomnia   Yes Historical Provider, MD  metoprolol tartrate (LOPRESSOR) 25 MG tablet Take 25 mg by mouth 2 (two) times daily.   Yes Historical Provider, MD  polyethylene glycol (MIRALAX / GLYCOLAX) packet Take 17 g by mouth daily.   Yes Historical Provider, MD  simvastatin (ZOCOR) 10 MG tablet Take 10 mg by mouth at bedtime.   Yes Historical Provider, MD   BP 138/72 mmHg  Pulse 68  Temp(Src) 100 F (37.8 C) (Oral)  Resp 18  SpO2 94% Physical Exam  Constitutional: He appears well-developed and well-nourished. No distress.  HENT:  Head: Normocephalic.  Eyes: Pupils are equal, round, and reactive to light.    Neck: Normal range of motion.  Cardiovascular: Normal rate and intact distal pulses.   Pulmonary/Chest: No respiratory distress.  Abdominal: Normal appearance. He exhibits no distension.  Musculoskeletal: Normal range of motion.  Neurological: He is alert. No cranial nerve deficit.  Skin: Skin is warm and dry. No rash noted.  Psychiatric: He has a normal mood and affect. His behavior is normal.  Nursing note and vitals reviewed.   ED Course  Procedures (including critical  care time) Labs Review Labs Reviewed - No data to display  Imaging Review Ct Head Wo Contrast  10/19/2014   CLINICAL DATA:  Found on floor after falling out of wheelchair. Hit head, with laceration at the right eyebrow. Concern for cervical spine injury. Initial encounter.  EXAM: CT HEAD WITHOUT CONTRAST  CT CERVICAL SPINE WITHOUT CONTRAST  TECHNIQUE: Multidetector CT imaging of the head and cervical spine was performed following the standard protocol without intravenous contrast. Multiplanar CT image reconstructions of the cervical spine were also generated.  COMPARISON:  CT of the head performed 04/28/2014, and  MRI of the brain performed 02/18/2009  FINDINGS: CT HEAD FINDINGS  There is no evidence of acute infarction, mass lesion, or intra- or extra-axial hemorrhage on CT.  Prominence of the ventricles and sulci reflects moderately severe cortical volume loss. Cerebellar atrophy is noted. A chronic infarct is seen at the left parietal and temporal lobes, with associated ex vacuo dilatation of the left lateral ventricle. A chronic lacunar infarct is noted at the right cerebellar hemisphere. Chronic lacunar infarcts are seen at the basal ganglia bilaterally. Scattered periventricular and subcortical white matter change likely reflects small vessel ischemic microangiopathy.  The brainstem and fourth ventricle are within normal limits. No mass effect or midline shift is seen.  There is no evidence of fracture; a craniectomy defect is noted overlying the left temporal lobe. The orbits are within normal limits. The paranasal sinuses and mastoid air cells are well-aerated. Mild soft tissue swelling and laceration are seen overlying the right frontal calvarium.  CT CERVICAL SPINE FINDINGS  There is no evidence of acute fracture or subluxation. Vertebral bodies demonstrate normal height. There is grade 1 anterolisthesis of C3 on C4, with underlying left-sided osseous fusion at the articular facets. Multilevel disc space narrowing and endplate degenerative change are seen along the lower cervical spine, with scattered anterior and posterior disc osteophyte complexes. Prevertebral soft tissues are within normal limits.  The visualized portions of the thyroid gland are unremarkable in appearance. The minimally visualized lung apices are clear. No significant soft tissue abnormalities are seen.  IMPRESSION: 1. No evidence of traumatic intracranial injury or fracture. 2. No evidence of acute fracture or subluxation along the cervical spine. 3. Moderately severe cortical volume loss and scattered small vessel ischemic microangiopathy. 4.  Chronic infarct at the left parietal and temporal lobes, with ex vacuo dilatation of the left lateral ventricle. 5. Chronic lacunar infarct at the right cerebellar hemisphere, and chronic lacunar infarcts at the basal ganglia bilaterally. 6. Mild diffuse degenerative change along the lower cervical spine. 7. Mild soft tissue swelling and laceration overlying the right frontal calvarium.   Electronically Signed   By: Roanna RaiderJeffery  Chang M.D.   On: 10/19/2014 20:01   Ct Cervical Spine Wo Contrast  10/19/2014   CLINICAL DATA:  Found on floor after falling out of wheelchair. Hit head, with laceration at the right eyebrow. Concern for cervical spine injury. Initial encounter.  EXAM: CT HEAD WITHOUT CONTRAST  CT CERVICAL SPINE WITHOUT CONTRAST  TECHNIQUE: Multidetector CT imaging of the head and cervical spine was performed following the standard protocol without intravenous contrast. Multiplanar CT image reconstructions of the cervical spine were also generated.  COMPARISON:  CT of the head performed 04/28/2014, and MRI of the brain performed 02/18/2009  FINDINGS: CT HEAD FINDINGS  There is no evidence of acute infarction, mass lesion, or intra- or extra-axial hemorrhage on CT.  Prominence of the ventricles and sulci reflects moderately severe cortical  volume loss. Cerebellar atrophy is noted. A chronic infarct is seen at the left parietal and temporal lobes, with associated ex vacuo dilatation of the left lateral ventricle. A chronic lacunar infarct is noted at the right cerebellar hemisphere. Chronic lacunar infarcts are seen at the basal ganglia bilaterally. Scattered periventricular and subcortical white matter change likely reflects small vessel ischemic microangiopathy.  The brainstem and fourth ventricle are within normal limits. No mass effect or midline shift is seen.  There is no evidence of fracture; a craniectomy defect is noted overlying the left temporal lobe. The orbits are within normal limits. The  paranasal sinuses and mastoid air cells are well-aerated. Mild soft tissue swelling and laceration are seen overlying the right frontal calvarium.  CT CERVICAL SPINE FINDINGS  There is no evidence of acute fracture or subluxation. Vertebral bodies demonstrate normal height. There is grade 1 anterolisthesis of C3 on C4, with underlying left-sided osseous fusion at the articular facets. Multilevel disc space narrowing and endplate degenerative change are seen along the lower cervical spine, with scattered anterior and posterior disc osteophyte complexes. Prevertebral soft tissues are within normal limits.  The visualized portions of the thyroid gland are unremarkable in appearance. The minimally visualized lung apices are clear. No significant soft tissue abnormalities are seen.  IMPRESSION: 1. No evidence of traumatic intracranial injury or fracture. 2. No evidence of acute fracture or subluxation along the cervical spine. 3. Moderately severe cortical volume loss and scattered small vessel ischemic microangiopathy. 4. Chronic infarct at the left parietal and temporal lobes, with ex vacuo dilatation of the left lateral ventricle. 5. Chronic lacunar infarct at the right cerebellar hemisphere, and chronic lacunar infarcts at the basal ganglia bilaterally. 6. Mild diffuse degenerative change along the lower cervical spine. 7. Mild soft tissue swelling and laceration overlying the right frontal calvarium.   Electronically Signed   By: Roanna Raider M.D.   On: 10/19/2014 20:01    Laceration was closed by physician assistant.  See accompanying note.  MDM   Final diagnoses:  Fall  Facial laceration, initial encounter        Nelva Nay, MD 10/23/14 [phone]

## 2014-10-19 NOTE — ED Notes (Signed)
Bed: WA17 Expected date: 10/19/14 Expected time: 5:22 PM Means of arrival: Ambulance Comments: Fall head injury

## 2014-10-19 NOTE — ED Provider Notes (Signed)
Dr. Radford PaxBeaton request for me to suture laceration of R eyebrow.    LACERATION REPAIR Performed by: Fayrene HelperRAN,Alex Ruiz Authorized byFayrene Helper: Alex Ruiz Consent: Verbal consent obtained. Risks and benefits: risks, benefits and alternatives were discussed Consent given by: patient Patient identity confirmed: provided demographic data Prepped and Draped in normal sterile fashion Wound explored  Laceration Location: R eyebrow  Laceration Length: 2cm  No Foreign Bodies seen or palpated  Anesthesia: local infiltration  Local anesthetic: lidocaine 2% w epinephrine  Anesthetic total: 1 ml  Irrigation method: syringe Amount of cleaning: standard  Skin closure: prolene 6.0  Number of sutures: 3  Technique: simple interrupt  Patient tolerance: Patient tolerated the procedure well with no immediate complications.   Fayrene HelperBowie Araiya Tilmon, PA-C 10/19/14 2059

## 2014-12-24 ENCOUNTER — Emergency Department (HOSPITAL_COMMUNITY): Payer: Medicare Other

## 2014-12-24 ENCOUNTER — Inpatient Hospital Stay (HOSPITAL_COMMUNITY)
Admission: EM | Admit: 2014-12-24 | Discharge: 2014-12-26 | DRG: 682 | Disposition: A | Discharge status: Hospice | Payer: Medicare Other | Attending: Internal Medicine | Admitting: Internal Medicine

## 2014-12-24 ENCOUNTER — Encounter (HOSPITAL_COMMUNITY): Payer: Self-pay | Admitting: Emergency Medicine

## 2014-12-24 DIAGNOSIS — J9601 Acute respiratory failure with hypoxia: Secondary | ICD-10-CM | POA: Diagnosis present

## 2014-12-24 DIAGNOSIS — Z515 Encounter for palliative care: Secondary | ICD-10-CM | POA: Diagnosis not present

## 2014-12-24 DIAGNOSIS — R509 Fever, unspecified: Secondary | ICD-10-CM

## 2014-12-24 DIAGNOSIS — E119 Type 2 diabetes mellitus without complications: Secondary | ICD-10-CM | POA: Diagnosis present

## 2014-12-24 DIAGNOSIS — I10 Essential (primary) hypertension: Secondary | ICD-10-CM | POA: Diagnosis present

## 2014-12-24 DIAGNOSIS — N179 Acute kidney failure, unspecified: Principal | ICD-10-CM

## 2014-12-24 DIAGNOSIS — J690 Pneumonitis due to inhalation of food and vomit: Secondary | ICD-10-CM | POA: Diagnosis present

## 2014-12-24 DIAGNOSIS — E860 Dehydration: Secondary | ICD-10-CM | POA: Diagnosis present

## 2014-12-24 DIAGNOSIS — E870 Hyperosmolality and hypernatremia: Secondary | ICD-10-CM | POA: Diagnosis present

## 2014-12-24 DIAGNOSIS — Z8673 Personal history of transient ischemic attack (TIA), and cerebral infarction without residual deficits: Secondary | ICD-10-CM

## 2014-12-24 DIAGNOSIS — Z66 Do not resuscitate: Secondary | ICD-10-CM | POA: Diagnosis present

## 2014-12-24 LAB — COMPREHENSIVE METABOLIC PANEL
ALT: 9 U/L — ABNORMAL LOW (ref 17–63)
AST: 19 U/L (ref 15–41)
Albumin: 2.5 g/dL — ABNORMAL LOW (ref 3.5–5.0)
Alkaline Phosphatase: 58 U/L (ref 38–126)
Anion gap: 13 (ref 5–15)
BUN: 85 mg/dL — AB (ref 6–20)
CO2: 25 mmol/L (ref 22–32)
Calcium: 8.3 mg/dL — ABNORMAL LOW (ref 8.9–10.3)
Chloride: 124 mmol/L — ABNORMAL HIGH (ref 101–111)
Creatinine, Ser: 3.57 mg/dL — ABNORMAL HIGH (ref 0.61–1.24)
GFR calc non Af Amer: 15 mL/min — ABNORMAL LOW (ref >60)
GFR, EST AFRICAN AMERICAN: 18 mL/min — AB (ref >60)
Glucose, Bld: 188 mg/dL — ABNORMAL HIGH (ref 65–99)
Potassium: 4.4 mmol/L (ref 3.5–5.1)
SODIUM: 162 mmol/L — AB (ref 135–145)
TOTAL PROTEIN: 6.5 g/dL (ref 6.5–8.1)
Total Bilirubin: 1.1 mg/dL (ref 0.3–1.2)

## 2014-12-24 LAB — BASIC METABOLIC PANEL
Anion gap: 8 (ref 5–15)
Anion gap: 5 (ref 5–15)
BUN: 87 mg/dL — AB (ref 6–20)
BUN: 83 mg/dL — AB (ref 6–20)
CALCIUM: 8 mg/dL — AB (ref 8.9–10.3)
CALCIUM: 7.6 mg/dL — AB (ref 8.9–10.3)
CHLORIDE: 128 mmol/L — AB (ref 101–111)
CO2: 25 mmol/L (ref 22–32)
CO2: 25 mmol/L (ref 22–32)
Chloride: 127 mmol/L — ABNORMAL HIGH (ref 101–111)
Creatinine, Ser: 3.06 mg/dL — ABNORMAL HIGH (ref 0.61–1.24)
Creatinine, Ser: 3.09 mg/dL — ABNORMAL HIGH (ref 0.61–1.24)
GFR calc Af Amer: 21 mL/min — ABNORMAL LOW (ref >60)
GFR calc non Af Amer: 18 mL/min — ABNORMAL LOW (ref >60)
GFR, EST AFRICAN AMERICAN: 21 mL/min — AB (ref >60)
GFR, EST NON AFRICAN AMERICAN: 18 mL/min — AB (ref >60)
Glucose, Bld: 196 mg/dL — ABNORMAL HIGH (ref 65–99)
Glucose, Bld: 191 mg/dL — ABNORMAL HIGH (ref 65–99)
Potassium: 4.1 mmol/L (ref 3.5–5.1)
Potassium: 4.4 mmol/L (ref 3.5–5.1)
SODIUM: 158 mmol/L — AB (ref 135–145)
Sodium: 160 mmol/L — ABNORMAL HIGH (ref 135–145)

## 2014-12-24 LAB — CBC WITH DIFFERENTIAL/PLATELET
BASOS ABS: 0 10*3/uL (ref 0.0–0.1)
Basophils Relative: 0 % (ref 0–1)
Eosinophils Absolute: 0.1 10*3/uL (ref 0.0–0.7)
Eosinophils Relative: 1 % (ref 0–5)
HEMATOCRIT: 57.4 % — AB (ref 39.0–52.0)
Hemoglobin: 18.2 g/dL — ABNORMAL HIGH (ref 13.0–17.0)
LYMPHS PCT: 16 % (ref 12–46)
Lymphs Abs: 1.6 10*3/uL (ref 0.7–4.0)
MCH: 30.1 pg (ref 26.0–34.0)
MCHC: 31.7 g/dL (ref 30.0–36.0)
MCV: 95 fL (ref 78.0–100.0)
Monocytes Absolute: 1.3 10*3/uL — ABNORMAL HIGH (ref 0.1–1.0)
Monocytes Relative: 13 % — ABNORMAL HIGH (ref 3–12)
Neutro Abs: 7.1 10*3/uL (ref 1.7–7.7)
Neutrophils Relative %: 70 % (ref 43–77)
Platelets: 131 10*3/uL — ABNORMAL LOW (ref 150–400)
RBC: 6.04 MIL/uL — ABNORMAL HIGH (ref 4.22–5.81)
RDW: 15.4 % (ref 11.5–15.5)
WBC: 10.1 10*3/uL (ref 4.0–10.5)

## 2014-12-24 LAB — URINALYSIS, ROUTINE W REFLEX MICROSCOPIC
Glucose, UA: NEGATIVE mg/dL
Ketones, ur: NEGATIVE mg/dL
NITRITE: NEGATIVE
PH: 5 (ref 5.0–8.0)
Protein, ur: NEGATIVE mg/dL
SPECIFIC GRAVITY, URINE: 1.022 (ref 1.005–1.030)
Urobilinogen, UA: 1 mg/dL (ref 0.0–1.0)

## 2014-12-24 LAB — CBC
HCT: 52.2 % — ABNORMAL HIGH (ref 39.0–52.0)
Hemoglobin: 16.3 g/dL (ref 13.0–17.0)
MCH: 29.6 pg (ref 26.0–34.0)
MCHC: 31.2 g/dL (ref 30.0–36.0)
MCV: 94.9 fL (ref 78.0–100.0)
PLATELETS: DECREASED 10*3/uL (ref 150–400)
RBC: 5.5 MIL/uL (ref 4.22–5.81)
RDW: 15.4 % (ref 11.5–15.5)
WBC: 9.7 10*3/uL (ref 4.0–10.5)

## 2014-12-24 LAB — I-STAT CG4 LACTIC ACID, ED: Lactic Acid, Venous: 2.69 mmol/L (ref 0.5–2.0)

## 2014-12-24 LAB — URINE MICROSCOPIC-ADD ON

## 2014-12-24 LAB — GLUCOSE, CAPILLARY: Glucose-Capillary: 175 mg/dL — ABNORMAL HIGH (ref 65–99)

## 2014-12-24 MED ORDER — INSULIN ASPART 100 UNIT/ML ~~LOC~~ SOLN
0.0000 [IU] | Freq: Three times a day (TID) | SUBCUTANEOUS | Status: DC
  Administered 2014-12-24: 2 [IU] via SUBCUTANEOUS
  Administered 2014-12-25: 1 [IU] via SUBCUTANEOUS

## 2014-12-24 MED ORDER — CLOPIDOGREL BISULFATE 75 MG PO TABS
75.0000 mg | ORAL_TABLET | Freq: Every day | ORAL | Status: DC
  Filled 2014-12-24 (×2): qty 1

## 2014-12-24 MED ORDER — ONDANSETRON HCL 4 MG/2ML IJ SOLN: 4.0000 mg | Freq: Four times a day (QID) | INTRAMUSCULAR | Status: DC | PRN

## 2014-12-24 MED ORDER — SODIUM CHLORIDE 0.9 % IV BOLUS (SEPSIS): 1000.0000 mL | Freq: Once | INTRAVENOUS | Status: DC

## 2014-12-24 MED ORDER — ACETAMINOPHEN 650 MG RE SUPP
650.0000 mg | Freq: Four times a day (QID) | RECTAL | Status: DC | PRN
Start: 2014-12-24 — End: 2014-12-26

## 2014-12-24 MED ORDER — SODIUM CHLORIDE 0.45 % IV SOLN
INTRAVENOUS | Status: DC
Start: 2014-12-24 — End: 2014-12-25
  Administered 2014-12-24: 17:00:00 via INTRAVENOUS

## 2014-12-24 MED ORDER — CETYLPYRIDINIUM CHLORIDE 0.05 % MT LIQD
7.0000 mL | Freq: Two times a day (BID) | OROMUCOSAL | Status: DC
  Administered 2014-12-24: 7 mL via OROMUCOSAL

## 2014-12-24 MED ORDER — ENOXAPARIN SODIUM 30 MG/0.3ML ~~LOC~~ SOLN
30.0000 mg | SUBCUTANEOUS | Status: DC
  Administered 2014-12-24: 30 mg via SUBCUTANEOUS
  Filled 2014-12-24 (×2): qty 0.3

## 2014-12-24 MED ORDER — ACETAMINOPHEN 325 MG PO TABS: 650.0000 mg | ORAL_TABLET | Freq: Four times a day (QID) | ORAL | Status: DC | PRN

## 2014-12-24 MED ORDER — ONDANSETRON HCL 4 MG PO TABS: 4.0000 mg | ORAL_TABLET | Freq: Four times a day (QID) | ORAL | Status: DC | PRN

## 2014-12-24 MED ORDER — CHLORHEXIDINE GLUCONATE 0.12 % MT SOLN
15.0000 mL | Freq: Two times a day (BID) | OROMUCOSAL | Status: DC
  Administered 2014-12-24 – 2014-12-25 (×2): 15 mL via OROMUCOSAL
  Filled 2014-12-24 (×4): qty 15

## 2014-12-24 MED ORDER — SODIUM CHLORIDE 0.45 % IV BOLUS
250.0000 mL | Freq: Once | INTRAVENOUS | Status: AC
  Administered 2014-12-24: 250 mL via INTRAVENOUS

## 2014-12-24 MED ORDER — SODIUM CHLORIDE 0.9 % IV BOLUS (SEPSIS)
1000.0000 mL | Freq: Once | INTRAVENOUS | Status: AC
  Administered 2014-12-24: 1000 mL via INTRAVENOUS

## 2014-12-24 NOTE — ED Provider Notes (Signed)
CSN: [id number]     Arrival date & time 12/24/14  1118 History   First MD Initiated Contact with Patient 12/24/14 1134     Chief Complaint  Patient presents with  . Hypotension  . Shortness of Breath  . Tachycardia    HPI  Patient since her nursing facility with staff concerns of decreased interactivity, hypoxia, hypotension. Patient has multiple medical issues, including prior strokes, baseline ataxia, dysphagia. EMS reports that the patient had laryngoscopy yesterday, with recognition of new laryngeal mass. Patient is nonverbal, level V caveat. Per EMS report the patient was hypotensive, hypoxic, tachycardic, tachypneic in route, requiring oxygen to maintain oxygen saturations above 90%. Patient is here with a MOS T form, requesting medical interventions short of mechanical ventilation, CPR.  Past Medical History  Diagnosis Date  . Hypertension   . Diabetes mellitus without complication   . Stroke    Past Surgical History  Procedure Laterality Date  . Laryngoscopy     History reviewed. No pertinent family history. History  Substance Use Topics  . Smoking status: Unknown If Ever Smoked  . Smokeless tobacco: Not on file  . Alcohol Use: No    Review of Systems  Unable to perform ROS: Patient nonverbal      Allergies  Review of patient's allergies indicates no known allergies.  Home Medications   Prior to Admission medications   Medication Sig Start Date End Date Taking? Authorizing Provider  amLODipine (NORVASC) 10 MG tablet Take 10 mg by mouth daily.    Historical Provider, MD  cetirizine (ZYRTEC) 10 MG tablet Take 10 mg by mouth at bedtime.     Historical Provider, MD  cholecalciferol (VITAMIN D) 1000 UNITS tablet Take 1,000 Units by mouth daily.    Historical Provider, MD  clopidogrel (PLAVIX) 75 MG tablet Take 75 mg by mouth daily.    Historical Provider, MD  insulin aspart (NOVOLOG) 100 UNIT/ML injection Inject 0-6 Units into the skin daily as needed for high  blood sugar. According to sliding scale: Hold for <120; 120-150 = 3 units; >150 = 6 units    Historical Provider, MD  Insulin Detemir (LEVEMIR) 100 UNIT/ML Pen Inject 3 Units into the skin daily with breakfast.    Historical Provider, MD  lisinopril (PRINIVIL,ZESTRIL) 20 MG tablet Take 20 mg by mouth daily.    Historical Provider, MD  Melatonin 3 MG TABS Take 3 mg by mouth at bedtime. For insomnia    Historical Provider, MD  metoprolol tartrate (LOPRESSOR) 25 MG tablet Take 25 mg by mouth 2 (two) times daily.    Historical Provider, MD  polyethylene glycol (MIRALAX / GLYCOLAX) packet Take 17 g by mouth daily.    Historical Provider, MD  simvastatin (ZOCOR) 10 MG tablet Take 10 mg by mouth at bedtime.    Historical Provider, MD   BP 93/77 mmHg  Pulse 115  Temp(Src) 98.6 F (37 C) (Oral)  Resp 36  SpO2 91% Physical Exam  Constitutional: He appears listless. He appears cachectic. He has a sickly appearance. He appears distressed.  HENT:  Head: Normocephalic and atraumatic.  Mouth/Throat: Mucous membranes are dry.    Eyes: Conjunctivae and EOM are normal.  Patient does not track visually  Cardiovascular: Regular rhythm.  Tachycardia present.   Pulmonary/Chest: Accessory muscle usage present. No stridor. Tachypnea noted. No respiratory distress. He has decreased breath sounds.  Abdominal: He exhibits no distension.  Musculoskeletal: He exhibits no edema.  Neurological: He appears listless.  Patient moves all families minimally,  spontaneously, intermittently follows some commands, poor grip testing, otherwise is essentially nonverbal, with notable atrophy  Skin: Skin is warm and dry.  Psychiatric: His mood appears anxious. He is withdrawn. Cognition and memory are impaired. He is noncommunicative.  Nursing note and vitals reviewed.   ED Course  Procedures (including critical care time) Labs Review Labs Reviewed  CULTURE, BLOOD (ROUTINE X 2)  CULTURE, BLOOD (ROUTINE X 2)  URINE  CULTURE  CBC WITH DIFFERENTIAL/PLATELET  COMPREHENSIVE METABOLIC PANEL  URINALYSIS, ROUTINE W REFLEX MICROSCOPIC  I-STAT CG4 LACTIC ACID, ED    Imaging Review No results found.   EKG Interpretation None     Pulse ox 97% with supplemental oxygen abnormal Cardiac 121 sinus tach abnormal   Discharge Summary 2010  DATE OF ADMISSION:  02/17/2009  DATE OF DISCHARGE:  02/20/2009                                DISCHARGE SUMMARY    DISCHARGE DIAGNOSES:  1. Acute left brain stroke (small area of acute infarction in the left      medial occipital lobe per MRI of the brain on February 18, 2009).  2. Unsteady gait/gait disorder and mild right-sided hemiparesis      secondary to the acute stroke.  3. History of previous left brain stroke with chronic expressive      aphasia.  4. Malignant hypertension.  5. Acute renal insufficiency, resolved with IV fluids.  6. Thrombocytopenia, etiology unclear.  However, the patient's      platelet count remained stable ranging from 100-110.  Vitamin B12      and TSH was were both within normal limits.  7. Hyperlipidemia.   On re-check, the patient remains in similar condition.  Update: Patient appears markedly better after initial fluid resuscitation.  On repeat exam the patient's heart rate is now less than 100, respiratory rate 30, blood pressure improved. Labs notable for acute kidney failure, hypernatremia, 162.  Patient will switch from normal saline resuscitation to have normal saline.  Patient should receive 20 hours of 250 mL per hour of normal saline to achieve normalization of sodium.    MDM   Final diagnoses:  Acute kidney injury  Hypernatremia   this baseline not interactive male presents from his nursing facility with concerns of listlessness, further decline in functionality. Here the patient is noninteractive, and initial exam suggests gross dehydration. Patient was initially resuscitated with 2 L normal saline, and labs  demonstrate acute kidney failure, hypernatremia. Patient had change in resuscitation to half-normal saline, and given his need for continuous fluid resuscitation, was admitted for further evaluation, management.   CRITICAL CARE Performed by: Gerhard MunchLOCKWOOD, Kelyse Pask Total critical care time: 40 Critical care time was exclusive of separately billable procedures and treating other patients. Critical care was necessary to treat or prevent imminent or life-threatening deterioration. Critical care was time spent personally by me on the following activities: development of treatment plan with patient and/or surrogate as well as nursing, discussions with consultants, evaluation of patient's response to treatment, examination of patient, obtaining history from patient or surrogate, ordering and performing treatments and interventions, ordering and review of laboratory studies, ordering and review of radiographic studies, pulse oximetry and re-evaluation of patient's condition.   Gerhard Munchobert April Colter, MD 12/24/14 [phone]

## 2014-12-24 NOTE — ED Notes (Signed)
Pt being transported upstairs at this time. Gave report to Enbridge Energyseirra RN

## 2014-12-24 NOTE — H&P (Signed)
PCP:   No PCP Per Patient   Chief Complaint:  Altered mental status  HPI:  79 year old male who  has a past medical history of Hypertension; Diabetes mellitus without complication; and Stroke. was brought to the hospital from the skilled facility due to chief complaints of hypotension, hypoxia change of mental status from baseline. Patient has history of prior strokes, baseline ataxia, dysphagia. Patient also had laryngoscopy done yesterday which showed new laryngeal mass. Patient is unable to provide any history and is nonverbal. History obtained from the ED notes. Per EMS report patient was hypotensive, hypoxic, tachycardic, tachypneic en route requiring oxygen to maintain O2 sats greater than 90%. Patient is a DO NOT RESUSCITATE. In the ED patient was found to be hypernatremic, sodium 162 and acute kidney injury. Patient given 1 L of normal saline bolus, and started on half normal saline at 250 mL per hour for 4 hours.  Allergies:  No Known Allergies    Past Medical History  Diagnosis Date  . Hypertension   . Diabetes mellitus without complication   . Stroke     Past Surgical History  Procedure Laterality Date  . Laryngoscopy      Prior to Admission medications   Medication Sig Start Date End Date Taking? Authorizing Provider  acetaminophen (TYLENOL) 325 MG tablet Take 650 mg by mouth every 4 (four) hours as needed for fever.   Yes Historical Provider, MD  amLODipine (NORVASC) 10 MG tablet Take 10 mg by mouth daily.   Yes Historical Provider, MD  cetirizine (ZYRTEC) 10 MG tablet Take 10 mg by mouth every evening.    Yes Historical Provider, MD  cholecalciferol (VITAMIN D) 1000 UNITS tablet Take 1,000 Units by mouth daily.   Yes Historical Provider, MD  clopidogrel (PLAVIX) 75 MG tablet Take 75 mg by mouth daily.   Yes Historical Provider, MD  insulin aspart (NOVOLOG) 100 UNIT/ML injection Inject 0-6 Units into the skin daily as needed for high blood sugar. According to  sliding scale: Hold for <120; 120-150 = 3 units; >150 = 6 units   Yes Historical Provider, MD  Insulin Detemir (LEVEMIR) 100 UNIT/ML Pen Inject 3 Units into the skin daily with breakfast.   Yes Historical Provider, MD  lisinopril (PRINIVIL,ZESTRIL) 20 MG tablet Take 20 mg by mouth daily.   Yes Historical Provider, MD  LORazepam (ATIVAN) 0.5 MG tablet Take 0.5 mg by mouth once.   Yes Historical Provider, MD  Melatonin 3 MG TABS Take 3 mg by mouth at bedtime. For insomnia   Yes Historical Provider, MD  metoprolol tartrate (LOPRESSOR) 25 MG tablet Take 25 mg by mouth 2 (two) times daily.   Yes Historical Provider, MD  NUTRITIONAL SUPPLEMENT LIQD Take 1 Can by mouth 3 (three) times daily with meals. *Magic Cup*   Yes Historical Provider, MD  Nutritional Supplements (NUTRITIONAL DRINK) LIQD Take 1 Bottle by mouth 4 (four) times daily. Takes at 0900, 1300, 1700, and 2000 *Med-Pass*   Yes Historical Provider, MD  oxyCODONE (OXY IR/ROXICODONE) 5 MG immediate release tablet Take 5 mg by mouth 2 (two) times daily as needed for moderate pain or severe pain.   Yes Historical Provider, MD  polyethylene glycol (MIRALAX / GLYCOLAX) packet Take 17 g by mouth daily.   Yes Historical Provider, MD  simvastatin (ZOCOR) 10 MG tablet Take 10 mg by mouth at bedtime.   Yes Historical Provider, MD    Social History:  reports that he does not drink alcohol or use illicit  drugs. His tobacco history is not on file.  Patient nonverbal- could not obtain family history  Review of Systems:  As in the history of present illness, rest of the review of systems unobtainable     Physical Exam: Blood pressure 100/67, pulse 100, temperature 98.6 F (37 C), temperature source Oral, resp. rate 28, SpO2 91 %. Constitutional:   Patient is a well-developed and well-nourished male, extremely lethargic and not following commands though opens eyes to verbal stimuli. Head: Normocephalic and atraumatic Mouth: Mucus membranes  moist Eyes: PERRL, EOMI, conjunctivae normal Neck: Supple, No Thyromegaly Cardiovascular: RRR, S1 normal, S2 normal Pulmonary/Chest: CTAB, no wheezes, rales, or rhonchi Abdominal: Soft. Non-tender, non-distended, bowel sounds are normal, no masses, organomegaly, or guarding present.  Neurological: Alert, but not oriented 3, rest of the neuro exam could not be completed due to patient's altered mental status and unable to follow commands Extremities : No Cyanosis, Clubbing or Edema  Labs on Admission:  Basic Metabolic Panel:  Recent Labs Lab 12/24/14 1159  NA 162*  K 4.4  CL 124*  CO2 25  GLUCOSE 188*  BUN 85*  CREATININE 3.57*  CALCIUM 8.3*   Liver Function Tests:  Recent Labs Lab 12/24/14 1159  AST 19  ALT 9*  ALKPHOS 58  BILITOT 1.1  PROT 6.5  ALBUMIN 2.5*   No results for input(s): LIPASE, AMYLASE in the last 168 hours. No results for input(s): AMMONIA in the last 168 hours. CBC:  Recent Labs Lab 12/24/14 1159  WBC 10.1  NEUTROABS 7.1  HGB 18.2*  HCT 57.4*  MCV 95.0  PLT 131*    Radiological Exams on Admission: Dg Chest Port 1 View  12/24/2014   CLINICAL DATA:  Hypotension, shortness of breath, tachycardia, stroke.  EXAM: PORTABLE CHEST - 1 VIEW  COMPARISON:  CT chest 02/18/2009 and chest radiograph 02/17/2009.  FINDINGS: Trachea is midline. Heart size grossly within normal limits. Lungs are low in volume with minimal bibasilar atelectasis or vascular crowding. No airspace consolidation or pleural fluid.  IMPRESSION: Low lung volumes with minimal bibasilar atelectasis or vascular crowding.   Electronically Signed   By: Leanna BattlesMelinda  Blietz M.D.   On: 12/24/2014 13:02      Assessment/Plan Active Problems:   Hypernatremia   AKI (acute kidney injury)   DM (diabetes mellitus)  Hypernatremia I doubt this is acute, likely developing over the past few days, chronic hypernatremia Patient sodium 162, was given 1 L normal saline bolus and started on half normal  saline at 250 mL per hour for 4 hours. Patient's water deficit is 6.8 L. Will replace half of the water deficit over 24 hours. Patient already has received 1 bolus in the ED and now currently getting 250 mL per hour for next 4 hours. Will start IV normal saline at 100 mL per hour, check BMP every 4 hours.  Acute kidney injury Patient's baseline creatinine 1.10 as of 2010, today creatinine is 3.57. Will continue IV fluids and check CMP in a.m.  Hypoxia ? Cause Patient was hypoxic at the skilled facility requiring oxygen. Chest x-ray does not show any significant abnormality. Continue oxygen via Ventimask. Will check ABG.  History of CVA Patient has history of CVA with chronic expressive aphasia, continue Plavix  History of dysphagia Currently patient will be kept nothing by mouth, once more alert consider swallow evaluation for further recommendations.  DVT prophylaxis Lovenox  Code status: DO NOT RESUSCITATE  Family discussion: no family at bedside   Time Spent on  Admission:  60 minutes  Amaani Guilbault S [facility] Pager: [phone] 12/24/2014, 1:51 PM  If 7PM-7AM, please contact night-coverage  [email or web address]  Password [email or web address]

## 2014-12-24 NOTE — ED Notes (Signed)
[facility]. This nurse this morning noticed the patient had a low O2 saturation with hypotension. Nurse states his decreased mentation and ataxia has been going on for the last month, no solid foods d/t dysphasia. Per EMS does not appear to be in pain. Laryngoscopy yesterday showed a mass in his throat that appeared to be new. Patient has a MOST form at the nurses station.

## 2014-12-24 NOTE — ED Notes (Signed)
Attempted in and out Cath with no urine out put.

## 2014-12-24 NOTE — ED Notes (Signed)
Critical results given to edp Jeraldine LootsLockwood

## 2014-12-25 ENCOUNTER — Inpatient Hospital Stay (HOSPITAL_COMMUNITY): Payer: Medicare Other

## 2014-12-25 DIAGNOSIS — N179 Acute kidney failure, unspecified: Principal | ICD-10-CM

## 2014-12-25 DIAGNOSIS — E870 Hyperosmolality and hypernatremia: Secondary | ICD-10-CM

## 2014-12-25 DIAGNOSIS — J9601 Acute respiratory failure with hypoxia: Secondary | ICD-10-CM

## 2014-12-25 DIAGNOSIS — J690 Pneumonitis due to inhalation of food and vomit: Secondary | ICD-10-CM

## 2014-12-25 LAB — COMPREHENSIVE METABOLIC PANEL
ALT: 9 U/L — ABNORMAL LOW (ref 17–63)
ANION GAP: 13 (ref 5–15)
AST: 20 U/L (ref 15–41)
Albumin: 2.4 g/dL — ABNORMAL LOW (ref 3.5–5.0)
Alkaline Phosphatase: 51 U/L (ref 38–126)
BILIRUBIN TOTAL: 1.4 mg/dL — AB (ref 0.3–1.2)
BUN: 77 mg/dL — AB (ref 6–20)
CO2: 23 mmol/L (ref 22–32)
Calcium: 7.8 mg/dL — ABNORMAL LOW (ref 8.9–10.3)
Chloride: 123 mmol/L — ABNORMAL HIGH (ref 101–111)
Creatinine, Ser: 2.9 mg/dL — ABNORMAL HIGH (ref 0.61–1.24)
GFR calc Af Amer: 23 mL/min — ABNORMAL LOW (ref >60)
GFR calc non Af Amer: 20 mL/min — ABNORMAL LOW (ref >60)
Glucose, Bld: 189 mg/dL — ABNORMAL HIGH (ref 65–99)
POTASSIUM: 4.6 mmol/L (ref 3.5–5.1)
Sodium: 159 mmol/L — ABNORMAL HIGH (ref 135–145)
TOTAL PROTEIN: 6.2 g/dL — AB (ref 6.5–8.1)

## 2014-12-25 LAB — CBC
HEMATOCRIT: 51 % (ref 39.0–52.0)
HEMOGLOBIN: 16.2 g/dL (ref 13.0–17.0)
MCH: 30 pg (ref 26.0–34.0)
MCHC: 31.8 g/dL (ref 30.0–36.0)
MCV: 94.4 fL (ref 78.0–100.0)
Platelets: 104 10*3/uL — ABNORMAL LOW (ref 150–400)
RBC: 5.4 MIL/uL (ref 4.22–5.81)
RDW: 15.4 % (ref 11.5–15.5)
WBC: 8.9 10*3/uL (ref 4.0–10.5)

## 2014-12-25 LAB — BASIC METABOLIC PANEL
Anion gap: 10 (ref 5–15)
BUN: 84 mg/dL — ABNORMAL HIGH (ref 6–20)
CHLORIDE: 126 mmol/L — AB (ref 101–111)
CO2: 24 mmol/L (ref 22–32)
CREATININE: 3.16 mg/dL — AB (ref 0.61–1.24)
Calcium: 7.8 mg/dL — ABNORMAL LOW (ref 8.9–10.3)
GFR calc Af Amer: 20 mL/min — ABNORMAL LOW (ref >60)
GFR calc non Af Amer: 18 mL/min — ABNORMAL LOW (ref >60)
Glucose, Bld: 183 mg/dL — ABNORMAL HIGH (ref 65–99)
Potassium: 4 mmol/L (ref 3.5–5.1)
SODIUM: 160 mmol/L — AB (ref 135–145)

## 2014-12-25 LAB — GLUCOSE, CAPILLARY
GLUCOSE-CAPILLARY: 138 mg/dL — AB (ref 65–99)
Glucose-Capillary: 156 mg/dL — ABNORMAL HIGH (ref 65–99)

## 2014-12-25 MED ORDER — LORAZEPAM 2 MG/ML IJ SOLN: 1.0000 mg | INTRAMUSCULAR | Status: DC | PRN

## 2014-12-25 MED ORDER — MORPHINE SULFATE 2 MG/ML IJ SOLN: 2.0000 mg | INTRAMUSCULAR | Status: DC | PRN

## 2014-12-25 MED ORDER — ATROPINE SULFATE 1 % OP SOLN
2.0000 [drp] | Freq: Four times a day (QID) | OPHTHALMIC | Status: DC
  Administered 2014-12-25 – 2014-12-26 (×5): 2 [drp] via SUBLINGUAL
  Filled 2014-12-25 (×2): qty 2

## 2014-12-25 MED ORDER — SODIUM CHLORIDE 0.9 % IV SOLN
1.0000 mg/h | INTRAVENOUS | Status: DC
  Administered 2014-12-25: 1 mg/h via INTRAVENOUS
  Filled 2014-12-25: qty 10

## 2014-12-25 MED ORDER — SCOPOLAMINE 1 MG/3DAYS TD PT72
1.0000 | MEDICATED_PATCH | TRANSDERMAL | Status: DC
  Administered 2014-12-25: 1.5 mg via TRANSDERMAL
  Filled 2014-12-25: qty 1

## 2014-12-25 NOTE — Evaluation (Signed)
Clinical/Bedside Swallow Evaluation Patient Details  Name: Alex Ruiz T Campise MRN: [id number] Date of Birth: 09/14/1935  Today's Date: 12/25/2014 Time: SLP Start Time (ACUTE ONLY): 1040 SLP Stop Time (ACUTE ONLY): 1055 SLP Time Calculation (min) (ACUTE ONLY): 15 min  Past Medical History:  Past Medical History  Diagnosis Date  . Hypertension   . Diabetes mellitus without complication   . Stroke    Past Surgical History:  Past Surgical History  Procedure Laterality Date  . Laryngoscopy     HPI:  79 year old male who  has a past medical history of Hypertension; Diabetes mellitus without complication; and Stroke. was brought to the hospital from the skilled facility due to chief complaints of hypotension, hypoxia change of mental status from baseline. Patient has history of prior strokes, baseline ataxia, dysphagia. Patient also had laryngoscopy done yesterday which showed new laryngeal mass. CXR does not show consolidation.    Assessment / Plan / Recommendation Clinical Impression  SLP provided thorough oral care, repositioning, head support and verbal/tactile cues to facilitate pt arousal and participation with PO trials. Oral hygiene severely impaired; pt intermittently biting swab. Pt did awaken for several moments but did not orally manipulate ice chips with tactile cues. Pt is not appropriate for POs at this time. Will follow for readiness.     Aspiration Risk  Severe    Diet Recommendation NPO        Other  Recommendations Oral Care Recommendations: Oral care QID   Follow Up Recommendations       Frequency and Duration min 2x/week  2 weeks   Pertinent Vitals/Pain NA    SLP Swallow Goals     Swallow Study Prior Functional Status       General Other Pertinent Information: 79 year old male who  has a past medical history of Hypertension; Diabetes mellitus without complication; and Stroke. was brought to the hospital from the skilled facility due to chief complaints  of hypotension, hypoxia change of mental status from baseline. Patient has history of prior strokes, baseline ataxia, dysphagia. Patient also had laryngoscopy done yesterday which showed new laryngeal mass. CXR does not show consolidation.  Type of Study: Bedside swallow evaluation Previous Swallow Assessment: none Diet Prior to this Study: NPO Temperature Spikes Noted: Yes Respiratory Status: Supplemental O2 delivered via (comment) History of Recent Intubation: No Behavior/Cognition: Lethargic/Drowsy Oral Cavity - Dentition: Poor condition Self-Feeding Abilities: Total assist Patient Positioning: Upright in bed Baseline Vocal Quality: Not observed Volitional Cough: Cognitively unable to elicit Volitional Swallow: Unable to elicit    Oral/Motor/Sensory Function Overall Oral Motor/Sensory Function: Other (comment) (unable to follow commands)   Ice Chips Ice chips: Impaired Presentation: Spoon Oral Phase Impairments: Reduced labial seal;Poor awareness of bolus;Impaired anterior to posterior transit;Reduced lingual movement/coordination Oral Phase Functional Implications: Right anterior spillage;Left anterior spillage   Thin Liquid Thin Liquid: Not tested    Nectar Thick Nectar Thick Liquid: Not tested   Honey Thick Honey Thick Liquid: Not tested   Puree Puree: Not tested   Solid   GO    Solid: Not tested      Harlon DittyBonnie Laraine Samet, MA CCC-SLP [phone]  Claudine MoutonDeBlois, Frans Valente Caroline 12/25/2014,11:02 AM

## 2014-12-25 NOTE — Clinical Social Work Note (Signed)
Clinical Social Work Assessment  Patient Details  Name: Azucena CecilGeorge T Gerdeman MRN: [id number] Date of Birth: 08/21/1935  Date of referral:  12/24/14               Reason for consult:  End of Life/Hospice, Facility Placement                Permission sought to share information with:  Facility Medical sales representativeContact Representative, Family Supports Permission granted to share information::  Yes, Verbal Permission Granted  Name::        Agency::  [facility]  Relationship::  Heywood FootmanJohn Havard, son  Contact Information:  [phone] (son, Jonny RuizJohn)  Housing/Transportation Living arrangements for the past 2 months:  Skilled Building surveyorursing Facility Source of Information:  Adult Children Patient Interpreter Needed:  None Criminal Activity/Legal Involvement Pertinent to Current Situation/Hospitalization:  No - Comment as needed Significant Relationships:  Adult Children Lives with:    Do you feel safe going back to the place where you live?  Yes Need for family participation in patient care:  Yes (Comment) (Pt unable to respond to commands, disoriented x4)  Care giving concerns:  None. Pt's treatment goals have been changed to comfort measures. Pt will be discharged to residential hospice.    Social Worker assessment / plan:  CSW spoke with Pt's son as Pt is unable to participate in assessment. CSW introduced self and explained role. CSW and son discussed over the phone the referral process for residential hospice. Son stated that whichever hospice facility had bed availability would be acceptable to family as they are out of town and simply want Pt to have a comfortable place to stay during the end of life process. CSW informed son [facility] [place] and that the next closest [facility]. Pt son agreeable to this.   CSW made referral to [facility]. Spoke with Diane there who confirmed beds were available. Diane to follow-up with CSW after referral  is reviewed.   Employment status:  Retired Health and safety inspectornsurance information:    PT Recommendations:  Not assessed at this time Information / Referral to community resources:  Other (Comment Required) (Residential Hospice facilities)  Patient/Family's Response to care:  Agreeable to residential Hospice placement  Patient/Family's Understanding of and Emotional Response to Diagnosis, Current Treatment, and Prognosis:  Pt son seems to be coping well with adjustment to end of life stage with Pt. He reported that he is out of town currently with work and would not be available to visit facilities. He stated that [place] acceptable.   Emotional Assessment Appearance:  Appears older than stated age Attitude/Demeanor/Rapport:  Unable to Assess Affect (typically observed):  Unable to Assess Orientation:   (Disoriented x4) Alcohol / Substance use:  Not Applicable Psych involvement (Current and /or in the community):  No (Comment)  Discharge Needs  Concerns to be addressed:  No discharge needs identified Readmission within the last 30 days:  No Current discharge risk:  Terminally ill Barriers to Discharge:  No Barriers Identified   Elaina HoopsCarter, Alaiza Yau M, LCSW 12/25/2014, 2:46 PM

## 2014-12-25 NOTE — Progress Notes (Signed)
PATIENT DETAILS Name: Alex Ruiz Age: 79 y.o. Sex: male Date of Birth: 04/19/1936 Admit Date: 12/24/2014 Admitting Physician Meredeth IdeGagan S Lama, MD PCP:No PCP Per Patient  Subjective: Mostly unresponsive-barely squeezes my hand with his right hand on command.  Assessment/Plan: Active Problems:   Hypernatremia: Secondary to dehydration. Started on IV fluids on admission, after speaking with son and healthcare power of attorney-John Papandrea-we have now decided to transition to full comfort measures. See below. IV fluids discontinued.    Acute renal failure: Secondary to prerenal azotemia from dehydration. Now being transitioned to full comfort measures, stop IV fluids. Place Foley for comfort.    Acute hypoxic respiratory failure: Secondary to aspiration pneumonia. Oxygen as needed, morphine/Ativan for comfort. No role for antibiotics in a setting of comfort care.    Aspiration pneumonia: Patient has had a large CVA in the past with residual left-sided deficits, dysarthria and dysphagia. Per son, patient drools constantly. Although chest x-ray on admission was negative for pneumonia, x-ray repeated this morning after hydration shows a right lower lobe infiltrate. After discussion with family-given very poor quality of life-we have transitioned to comfort measures. No role for antibiotics in this setting.    History of CVA: With dysphagia, aphasia and right-sided deficit-wheelchair bound and totally dependent on nursing staff for all activities of daily living. Since transition to comfort measures-discontinue all antiplatelet agents.    Dysphagia: Likely secondary to above-no further diagnostic/speech evaluation needed-on comfort measures.    DM (diabetes mellitus): Stop sliding scale insulin-transition to comfort measures    Palliative care: Unfortunate 79 year old male with prior history of a large CVA with residual aphasia, dysphagia, nonambulatory- bed to wheelchair  bound. Per son [name]-patient with very poor quality of life-with both bladder and bowel incontinence, and drooling at baseline. This M.D. spoke with [name] over the phone-he [place] in [place] (cell [phone]) -we discussed and treatment options-namely to continue with IV fluids, IV antibiotics or transition to full comfort status. Family at this time realizes that patient has very poor overall prognosis and has very poor quality of life, and would like to transition to full comfort status. Currently patient is unresponsive, suspect no significant oral intake in the past week or so with significant renal failure and hypernatremia. We will stop IV fluids, not start IV antibiotics-place Foley catheter for comfort and start the patient on morphine infusion. I have consulted social work for residential hospice placement. Suspect patient's life expectancy to be a matter of few days to a week or so.  Disposition: Remain inpatient-Residential hospice  Antimicrobial agents  See below  Anti-infectives    None      DVT Prophylaxis: None  Code Status: DNR  Family Communication [name]-over the phone  Procedures: None  CONSULTS:  None  Time spent 60 minutes-Greater than 50% of this time was spent in counseling, explanation of diagnosis, planning of further management, and coordination of care.  MEDICATIONS: Scheduled Meds: . atropine  2 drop Sublingual QID  . scopolamine  1 patch Transdermal Q72H   Continuous Infusions: . morphine     PRN Meds:.acetaminophen **OR** acetaminophen, LORazepam, morphine injection, [DISCONTINUED] ondansetron **OR** ondansetron (ZOFRAN) IV    PHYSICAL EXAM: Vital signs in last 24 hours: Filed Vitals:   12/24/14 1600 12/24/14 1702 12/24/14 2120 12/25/14 0547  BP: 108/69  100/71 123/73  Pulse: 98  102 115  Temp: 97.9 F (36.6  C)  98.2 F (36.8 C) 100.5 F (38.1 C)  TempSrc:   Oral Axillary  Resp: 26  20 22     Height:  6' (1.829 m)    SpO2: 95% 95% 90%     Weight change:  Filed Weights   There is no weight on file to calculate BMI.   Gen Exam: Unresponsive-barely squeezes my hand Neck: Supple, Chest: B/L Clear-except for a few bibasilar rales CVS: S1 S2 Regular, no murmurs.  Abdomen: soft, BS +, non tender, non distended.  Extremities: no edema, lower extremities warm to touch. Neurology: Unable to evaluate completely-but looks like patient has significant left-sided hemiplegia-just squeezes my hand with his right hand  Intake/Output from previous day: No intake or output data in the 24 hours ending 12/25/14 1130   LAB RESULTS: CBC  Recent Labs Lab 12/24/14 1159 12/24/14 1905 12/25/14 0825  WBC 10.1 9.7 8.9  HGB 18.2* 16.3 16.2  HCT 57.4* 52.2* 51.0  PLT 131* PLATELET CLUMPS NOTED ON SMEAR, COUNT APPEARS DECREASED 104*  MCV 95.0 94.9 94.4  MCH 30.1 29.6 30.0  MCHC 31.7 31.2 31.8  RDW 15.4 15.4 15.4  LYMPHSABS 1.6  --   --   MONOABS 1.3*  --   --   EOSABS 0.1  --   --   BASOSABS 0.0  --   --     Chemistries   Recent Labs Lab 12/24/14 1159 12/24/14 1905 12/24/14 2146 12/25/14 0020 12/25/14 0825  NA 162* 160* 158* 160* 159*  K 4.4 4.1 4.4 4.0 4.6  CL 124* 127* 128* 126* 123*  CO2 25 25 25 24 23   GLUCOSE 188* 196* 191* 183* 189*  BUN 85* 87* 83* 84* 77*  CREATININE 3.57* 3.06* 3.09* 3.16* 2.90*  CALCIUM 8.3* 8.0* 7.6* 7.8* 7.8*    CBG:  Recent Labs Lab 12/24/14 1642 12/25/14 0756  GLUCAP 175* 138*    GFR CrCl cannot be calculated (Unknown ideal weight.).  Coagulation profile No results for input(s): INR, PROTIME in the last 168 hours.  Cardiac Enzymes No results for input(s): CKMB, TROPONINI, MYOGLOBIN in the last 168 hours.  Invalid input(s): CK  Invalid input(s): POCBNP No results for input(s): DDIMER in the last 72 hours. No results for input(s): HGBA1C in the last 72 hours. No results for input(s): CHOL, HDL, LDLCALC, TRIG, CHOLHDL,  LDLDIRECT in the last 72 hours. No results for input(s): TSH, T4TOTAL, T3FREE, THYROIDAB in the last 72 hours.  Invalid input(s): FREET3 No results for input(s): VITAMINB12, FOLATE, FERRITIN, TIBC, IRON, RETICCTPCT in the last 72 hours. No results for input(s): LIPASE, AMYLASE in the last 72 hours.  Urine Studies No results for input(s): UHGB, CRYS in the last 72 hours.  Invalid input(s): UACOL, UAPR, USPG, UPH, UTP, UGL, UKET, UBIL, UNIT, UROB, ULEU, UEPI, UWBC, URBC, UBAC, CAST, UCOM, BILUA  MICROBIOLOGY: Recent Results (from the past 240 hour(s))  Blood Culture (routine x 2)     Status: None (Preliminary result)   Collection Time: 12/24/14 11:50 AM  Result Value Ref Range Status   Specimen Description BLOOD RIGHT HAND  Final   Special Requests BOTTLES DRAWN AEROBIC AND ANAEROBIC 5CC EACH  Final   Culture   Final           BLOOD CULTURE RECEIVED NO GROWTH TO DATE CULTURE WILL BE HELD FOR 5 DAYS BEFORE ISSUING A FINAL NEGATIVE REPORT Performed at [facility]    Report Status PENDING  Incomplete  Blood Culture (routine x 2)  Status: None (Preliminary result)   Collection Time: 12/24/14 12:02 PM  Result Value Ref Range Status   Specimen Description BLOOD LEFT ANTECUBITAL  Final   Special Requests BOTTLES DRAWN AEROBIC AND ANAEROBIC  Final   Culture   Final           BLOOD CULTURE RECEIVED NO GROWTH TO DATE CULTURE WILL BE HELD FOR 5 DAYS BEFORE ISSUING A FINAL NEGATIVE REPORT Performed at [facility]    Report Status PENDING  Incomplete    RADIOLOGY STUDIES/RESULTS: Dg Chest Port 1 View  12/25/2014   CLINICAL DATA:  Stroke. Potential hypertension. Nonresponsive. Recent fever. Evaluation for aspiration pneumonia.  EXAM: PORTABLE CHEST - 1 VIEW  COMPARISON:  12/24/2014 and earlier studies  FINDINGS: Heart size is normal. Aorta is tortuous. There is developing density in the medial right lung base consistent with infectious infiltrate and suspicious for  aspiration in the setting of stroke. Left lung is clear. No pulmonary edema.  IMPRESSION: Developing infiltrate in the right lower lobe. Suspect aspiration pneumonia.   Electronically Signed   By: Norva Pavlov M.D.   On: 12/25/2014 10:59   Dg Chest Port 1 View  12/24/2014   CLINICAL DATA:  Hypotension, shortness of breath, tachycardia, stroke.  EXAM: PORTABLE CHEST - 1 VIEW  COMPARISON:  CT chest 02/18/2009 and chest radiograph 02/17/2009.  FINDINGS: Trachea is midline. Heart size grossly within normal limits. Lungs are low in volume with minimal bibasilar atelectasis or vascular crowding. No airspace consolidation or pleural fluid.  IMPRESSION: Low lung volumes with minimal bibasilar atelectasis or vascular crowding.   Electronically Signed   By: Leanna Battles M.D.   On: 12/24/2014 13:02    Jeoffrey Massed, MD  [facility] Pager:[phone]  If 7PM-7AM, please contact night-coverage [email or web address] Password [email or web address] 12/25/2014, 11:30 AM   LOS: 1 day

## 2014-12-25 NOTE — Progress Notes (Signed)
CSW spoke with Pt's son, Jonny RuizJohn, regarding discharge planning. Pt son is agreeable to residential hospice placement [facility].  John stated that he is currently out of town in [place] due to job-related issues. CSW discussed with Jonny RuizJohn the referral process and confirmed that a referral would be made to the [facility]. John was agreeable to this and CSW provided contact information and address for the hospice facility.   CSW contacted Diane at [facility] and provided Pt demographics. Diane reported that they would review referral and call back with determination. Diane confirmed that beds are available at their facility.   Chad CordialLauren Carter, LCSWA 12/25/2014 2:25 PM [phone]

## 2014-12-25 NOTE — Progress Notes (Signed)
Initial Nutrition Assessment  DOCUMENTATION CODES:  Not applicable  INTERVENTION: - Recommend NGT versus PEG with Glucerna 1.2 @ 60 mL/hr which will provide 1728 kcal, 86 grams protein, and 1159 mL free water. Free water flush per MD. - RD to continue to monitor for needs  NUTRITION DIAGNOSIS:  Inadequate oral intake related to inability to eat as evidenced by NPO status.  GOAL:  Patient will meet greater than or equal to 90% of their needs  MONITOR:  TF tolerance, Weight trends, Labs, I & O's  REASON FOR ASSESSMENT:  Low Braden  ASSESSMENT: Pt with hx of HTN, DM, stroke with ataxia and dysphagia who is from a SNF and came in for hypotension and hypoxia change. He was nonverbal on admission.  Pt seen for low Braden. BMI WDL. Weight from 04/28/14 used for current note and calculations as no new weight entered since that time. Pt is nonverbal, no family in the room. SLP has recommended pt remain NPO. Unable to meet needs at this time. Labs and medications reviewed.  Labs: Na: 159 mmol/L, Cl: 123 mmol/L, BUN/creatinine now trending down, Ca low, GFR: 20, CBGs: 138-175 mg/dL.  Height:  Ht Readings from Last 1 Encounters:  12/25/14 6' (1.829 m)    Weight:  Wt Readings from Last 1 Encounters:  12/25/14 160 lb 0.9 oz (72.6 kg)    Ideal Body Weight:  80.9 kg (kg)  Wt Readings from Last 10 Encounters:  12/25/14 160 lb 0.9 oz (72.6 kg)  04/28/14 160 lb (72.576 kg)    BMI:  21.4 kg/m2  Estimated Nutritional Needs:  Kcal:  1650-1850   Protein:  75-90 grams  Fluid:  2.5-3 L/day with dehydration   Skin:  Wound (see comment) (stage 2 to sacrum)  Diet Order:  Diet NPO time specified  EDUCATION NEEDS:  No education needs identified at this time   Intake/Output Summary (Last 24 hours) at 12/25/14 1417 Last data filed at 12/25/14 1300  Gross per 24 hour  Intake      0 ml  Output      0 ml  Net      0 ml    Last BM:  PTA   Trenton GammonJessica Leonor Darnell, RD,  LDN Inpatient Clinical Dietitian Pager # [phone] After hours/weekend pager # [phone]

## 2014-12-26 ENCOUNTER — Encounter (HOSPITAL_COMMUNITY): Payer: Self-pay | Admitting: *Deleted

## 2014-12-26 LAB — HEMOGLOBIN A1C
Hgb A1c MFr Bld: 7.9 % — ABNORMAL HIGH (ref 4.8–5.6)
MEAN PLASMA GLUCOSE: 180 mg/dL

## 2014-12-26 MED ORDER — MORPHINE SULFATE 2 MG/ML IJ SOLN: 2.0000 mg | INTRAMUSCULAR | Status: AC | PRN

## 2014-12-26 MED ORDER — SCOPOLAMINE 1 MG/3DAYS TD PT72: 1.0000 | MEDICATED_PATCH | TRANSDERMAL | Status: AC

## 2014-12-26 MED ORDER — ATROPINE SULFATE 1 % OP SOLN: 2.0000 [drp] | Freq: Four times a day (QID) | OPHTHALMIC | Status: AC

## 2014-12-26 MED ORDER — SODIUM CHLORIDE 0.9 % IV SOLN: 2.0000 mg/h | INTRAVENOUS | Status: AC

## 2014-12-26 MED ORDER — LORAZEPAM 2 MG/ML IJ SOLN: 1.0000 mg | INTRAMUSCULAR | Status: AC | PRN

## 2014-12-26 NOTE — Progress Notes (Signed)
CSW spoke with Diane at [facility]. Diane reports that they are ready for Pt admission today. Consents with Pt son were completed via email.  DC paperwork completed and placed on chart. RN aware. PTAR scheduled.  CSW notified Pt son, Jonny RuizJohn, of transport. Jonny RuizJohn is agreeable.  CSW signing off but available as needs arise.  Chad CordialLauren Carter, LCSWA 12/26/2014 1:25 PM [phone]

## 2014-12-26 NOTE — Progress Notes (Signed)
Alex Ruiz CecilGeorge T Blackburn to be D/C'd [facility] per MD order.  Discussed with the patient and all questions fully answered.  VSS, Skin clean, dry and intact without evidence of skin break down, no evidence of skin tears noted. IV catheter discontinued intact. Site without signs and symptoms of complications. Dressing and pressure applied.  Patient escorted PTAR via stretcher.   Markel Kurtenbach D 12/26/2014 1:41 PM

## 2014-12-26 NOTE — Progress Notes (Signed)
Wasted 250mL of morphine down sink with Gayleen Oremhasity Hearn, RN.

## 2014-12-26 NOTE — Discharge Summary (Signed)
PATIENT DETAILS Name: Alex Ruiz Age: 79 y.o. Sex: male Date of Birth: 09/22/1935 MRN: [id number]. Admitting Physician: Meredeth IdeGagan S Lama, MD PCP:No PCP Per Patient  Admit Date: 12/24/2014 Discharge date: 12/26/2014  Recommendations for Outpatient Follow-up:  1. Optimize comfort care medications  PRIMARY DISCHARGE DIAGNOSIS:  Active Problems:   Hypernatremia   AKI (acute kidney injury)   DM (diabetes mellitus)      PAST MEDICAL HISTORY: Past Medical History  Diagnosis Date  . Hypertension   . Diabetes mellitus without complication   . Stroke     DISCHARGE MEDICATIONS: Current Discharge Medication List    START taking these medications   Details  atropine 1 % ophthalmic solution Place 2 drops under the tongue 4 (four) times daily.    LORazepam (ATIVAN) 2 MG/ML injection Inject 0.5 mLs (1 mg total) into the vein every 4 (four) hours as needed for sedation.    morphine 2 MG/ML injection Inject 1 mL (2 mg total) into the vein every hour as needed (comfort, sedation).    morphine 250 mg in sodium chloride 0.9 % 240 mL Inject 2 mg/hr into the vein continuous. Titrate as needed for comfort    scopolamine (TRANSDERM-SCOP) 1 MG/3DAYS Place 1 patch (1.5 mg total) onto the skin every 3 (three) days. Qty: 10 patch, Refills: 12      STOP taking these medications     acetaminophen (TYLENOL) 325 MG tablet      amLODipine (NORVASC) 10 MG tablet      cetirizine (ZYRTEC) 10 MG tablet      cholecalciferol (VITAMIN D) 1000 UNITS tablet      clopidogrel (PLAVIX) 75 MG tablet      insulin aspart (NOVOLOG) 100 UNIT/ML injection      Insulin Detemir (LEVEMIR) 100 UNIT/ML Pen      lisinopril (PRINIVIL,ZESTRIL) 20 MG tablet      LORazepam (ATIVAN) 0.5 MG tablet      Melatonin 3 MG TABS      metoprolol tartrate (LOPRESSOR) 25 MG tablet      NUTRITIONAL SUPPLEMENT LIQD      Nutritional Supplements (NUTRITIONAL DRINK) LIQD      oxyCODONE (OXY IR/ROXICODONE) 5 MG  immediate release tablet      polyethylene glycol (MIRALAX / GLYCOLAX) packet      simvastatin (ZOCOR) 10 MG tablet         ALLERGIES:  No Known Allergies  BRIEF HPI:  See H&P, Labs, Consult and Test reports for all details in brief, patient is a 79 year old male who  has a past medical history of Hypertension; Diabetes mellitus without complication; and Stroke. was brought to the hospital from the skilled facility due to chief complaints of hypotension, hypoxia change of mental status from baseline  CONSULTATIONS:   None  PERTINENT RADIOLOGIC STUDIES: Dg Chest Port 1 View  12/25/2014   CLINICAL DATA:  Stroke. Potential hypertension. Nonresponsive. Recent fever. Evaluation for aspiration pneumonia.  EXAM: PORTABLE CHEST - 1 VIEW  COMPARISON:  12/24/2014 and earlier studies  FINDINGS: Heart size is normal. Aorta is tortuous. There is developing density in the medial right lung base consistent with infectious infiltrate and suspicious for aspiration in the setting of stroke. Left lung is clear. No pulmonary edema.  IMPRESSION: Developing infiltrate in the right lower lobe. Suspect aspiration pneumonia.   Electronically Signed   By: Norva PavlovElizabeth  Brown M.D.   On: 12/25/2014 10:59   Dg Chest Port 1 View  12/24/2014   CLINICAL DATA:  Hypotension,  shortness of breath, tachycardia, stroke.  EXAM: PORTABLE CHEST - 1 VIEW  COMPARISON:  CT chest 02/18/2009 and chest radiograph 02/17/2009.  FINDINGS: Trachea is midline. Heart size grossly within normal limits. Lungs are low in volume with minimal bibasilar atelectasis or vascular crowding. No airspace consolidation or pleural fluid.  IMPRESSION: Low lung volumes with minimal bibasilar atelectasis or vascular crowding.   Electronically Signed   By: Leanna Battles M.D.   On: 12/24/2014 13:02     PERTINENT LAB RESULTS: CBC:  Recent Labs  12/24/14 1905 12/25/14 0825  WBC 9.7 8.9  HGB 16.3 16.2  HCT 52.2* 51.0  PLT PLATELET CLUMPS NOTED ON SMEAR,  COUNT APPEARS DECREASED 104*   CMET CMP     Component Value Date/Time   NA 159* 12/25/2014 0825   K 4.6 12/25/2014 0825   CL 123* 12/25/2014 0825   CO2 23 12/25/2014 0825   GLUCOSE 189* 12/25/2014 0825   BUN 77* 12/25/2014 0825   CREATININE 2.90* 12/25/2014 0825   CALCIUM 7.8* 12/25/2014 0825   PROT 6.2* 12/25/2014 0825   ALBUMIN 2.4* 12/25/2014 0825   AST 20 12/25/2014 0825   ALT 9* 12/25/2014 0825   ALKPHOS 51 12/25/2014 0825   BILITOT 1.4* 12/25/2014 0825   GFRNONAA 20* 12/25/2014 0825   GFRAA 23* 12/25/2014 0825    GFR Estimated Creatinine Clearance: 22.3 mL/min (by C-G formula based on Cr of 2.9). No results for input(s): LIPASE, AMYLASE in the last 72 hours. No results for input(s): CKTOTAL, CKMB, CKMBINDEX, TROPONINI in the last 72 hours. Invalid input(s): POCBNP No results for input(s): DDIMER in the last 72 hours.  Recent Labs  12/24/14 1905  HGBA1C 7.9*   No results for input(s): CHOL, HDL, LDLCALC, TRIG, CHOLHDL, LDLDIRECT in the last 72 hours. No results for input(s): TSH, T4TOTAL, T3FREE, THYROIDAB in the last 72 hours.  Invalid input(s): FREET3 No results for input(s): VITAMINB12, FOLATE, FERRITIN, TIBC, IRON, RETICCTPCT in the last 72 hours. Coags: No results for input(s): INR in the last 72 hours.  Invalid input(s): PT Microbiology: Recent Results (from the past 240 hour(s))  Urine culture     Status: None (Preliminary result)   Collection Time: 12/24/14 11:18 AM  Result Value Ref Range Status   Specimen Description URINE, CLEAN CATCH  Final   Special Requests NONE  Final   Culture   Final    Culture reincubated for better growth Performed at [facility]    Report Status PENDING  Incomplete  Blood Culture (routine x 2)     Status: None (Preliminary result)   Collection Time: 12/24/14 11:50 AM  Result Value Ref Range Status   Specimen Description BLOOD RIGHT HAND  Final   Special Requests BOTTLES DRAWN AEROBIC AND ANAEROBIC 5CC EACH   Final   Culture   Final           BLOOD CULTURE RECEIVED NO GROWTH TO DATE CULTURE WILL BE HELD FOR 5 DAYS BEFORE ISSUING A FINAL NEGATIVE REPORT Performed at [facility]    Report Status PENDING  Incomplete  Blood Culture (routine x 2)     Status: None (Preliminary result)   Collection Time: 12/24/14 12:02 PM  Result Value Ref Range Status   Specimen Description BLOOD LEFT ANTECUBITAL  Final   Special Requests BOTTLES DRAWN AEROBIC AND ANAEROBIC  Final   Culture   Final           BLOOD CULTURE RECEIVED NO GROWTH TO DATE CULTURE WILL  BE HELD FOR 5 DAYS BEFORE ISSUING A FINAL NEGATIVE REPORT Performed at [facility]    Report Status PENDING  Incomplete     BRIEF HOSPITAL COURSE:  Hypernatremia: Secondary to dehydration. Started on IV fluids on admission, after speaking with son and healthcare power of attorney-John Partington-we have now decided to transition to full comfort measures. See below. IV fluids discontinued.   Acute renal failure: Secondary to prerenal azotemia from dehydration. Transitioned to full comfort measures, . Foley for comfort.Only around 375 urine output.   Acute hypoxic respiratory failure: Secondary to aspiration pneumonia. Oxygen as needed, morphine/Ativan for comfort. No role for antibiotics in a setting of comfort care.   Aspiration pneumonia: Patient has had a large CVA in the past with residual left-sided deficits, dysarthria and dysphagia. Per son, patient drools constantly. Although chest x-ray on admission was negative for pneumonia, x-ray repeated on 12/25/14  after hydration shows a right lower lobe infiltrate. After discussion with family-given very poor quality of life-we have transitioned to comfort measures. No role for antibiotics in this setting.   History of CVA: With dysphagia, aphasia and right-sided deficit-wheelchair bound and totally dependent on nursing staff for all activities of daily living. Since transition to  comfort measures-discontinued all antiplatelet agents.   Dysphagia: Likely secondary to above-no further diagnostic/speech evaluation needed-on comfort measures.   DM (diabetes mellitus): Stop sliding scale insulin-transitioned to comfort measures   Palliative care: Unfortunate 79 year old male with prior history of a large CVA with residual aphasia, dysphagia, nonambulatory- bed to wheelchair bound. Per son Jonny Ruiz(John Plott)-patient with very poor quality of life-with both bladder and bowel incontinence, and drooling at baseline. This M.D. spoke with Heywood FootmanJohn Pelphrey over the phone-he is currently out of town in [place] (cell [phone]) -we discussed and treatment options-namely to continue with IV fluids, IV antibiotics or transition to full comfort status. Family at this time realizes that patient has very poor overall prognosis and has very poor quality of life, and would like to transition to full comfort status. Currently patient is unresponsive, suspect no significant oral intake in the past week or so with significant renal failure and hypernatremia. We have stopped IV fluids, not started IV antibiotics-placed a Foley catheter for comfort and started the patient on morphine infusion. Patient is essentially non responsive, plans are for discharge to residential hospice today   TODAY-DAY OF DISCHARGE:  Subjective:   Lanell MatarGeorge Dombeck today looks comfortable-barely arousable.  Objective:   Blood pressure 104/67, pulse 100, temperature 98.2 F (36.8 C), temperature source Oral, resp. rate 22, height 6' (1.829 m), weight 72.6 kg (160 lb 0.9 oz), SpO2 95 %.  Intake/Output Summary (Last 24 hours) at 12/26/14 1004 Last data filed at 12/26/14 0700  Gross per 24 hour  Intake 2202.52 ml  Output    375 ml  Net 1827.52 ml   Filed Weights   12/25/14 1417  Weight: 72.6 kg (160 lb 0.9 oz)    Exam Unresponsive-barely arousable on painful stimuli Chest:transmitted upper airway sounds CVS:S1S2  regular Abd: Soft  DISCHARGE CONDITION: Stable  DISPOSITION: Residential Hospice  DISCHARGE INSTRUCTIONS:    Activity:  As tolerated   Diet recommendation: Comfort feeds   Total Time spent on discharge equals 25  minutes.  SignedJeoffrey Massed: GHIMIRE,SHANKER 12/26/2014 10:04 AM

## 2014-12-27 LAB — URINE CULTURE: Colony Count: 50000

## 2014-12-30 LAB — CULTURE, BLOOD (ROUTINE X 2)
CULTURE: NO GROWTH
CULTURE: NO GROWTH

## 2015-01-07 DEATH — deceased

## 2017-03-11 IMAGING — CT CT CERVICAL SPINE W/O CM
3 of 6 series · 12 of 33 positions shown, 14 images · non-contrast
Comparison: CT of the head performed 04/28/2014, and MRI of the
brain performed 02/18/2009

CLINICAL DATA: Found on floor after falling out of wheelchair. Hit
head, with laceration at the right eyebrow. Concern for cervical
spine injury. Initial encounter.

EXAM:
CT HEAD WITHOUT CONTRAST
CT CERVICAL SPINE WITHOUT CONTRAST
TECHNIQUE: Multidetector CT imaging of the head and cervical spine was
performed following the standard protocol without intravenous
contrast. Multiplanar CT image reconstructions of the cervical spine
were also generated.

[Series 8: axial recon · axial · 0.23mm/px · z∈[+1068,+1187]mm · 4 of 100 slices shown, 5 images]
[im 20/100  soft-tissue]
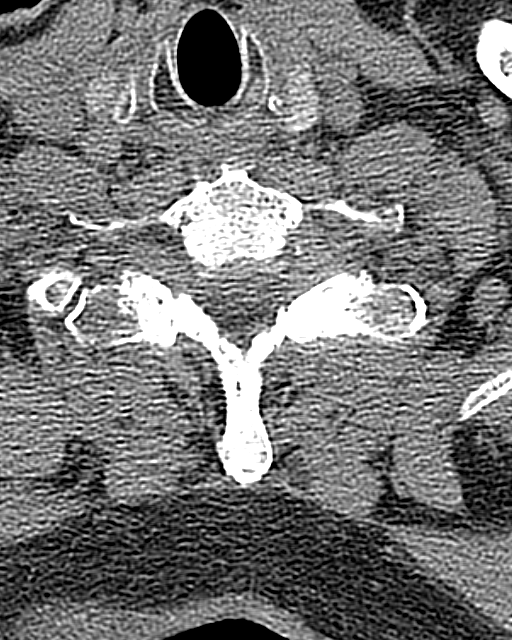
[im 20/100  bone]
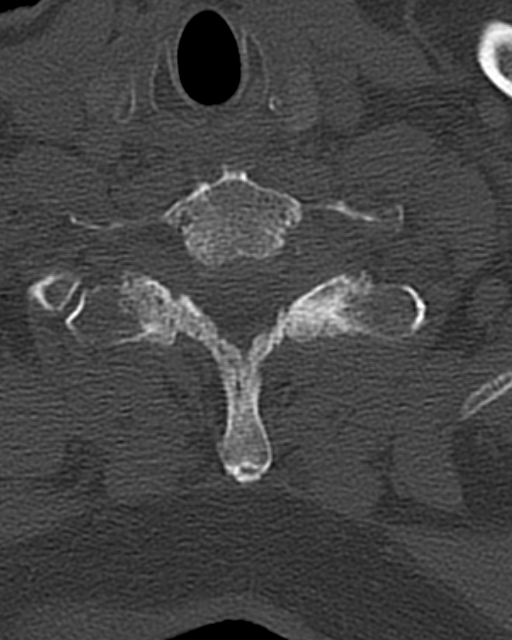
[im 40/100  bone]
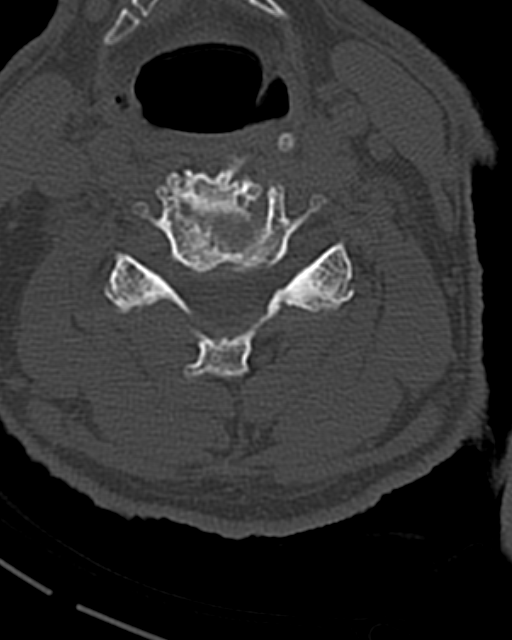
[im 60/100  bone]
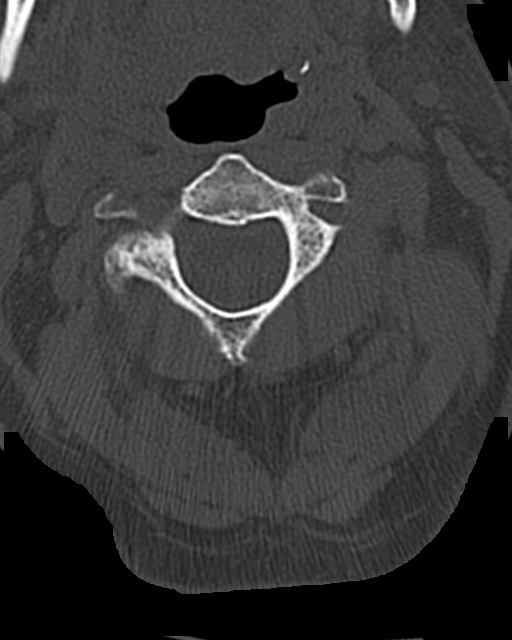
[im 80/100  bone]
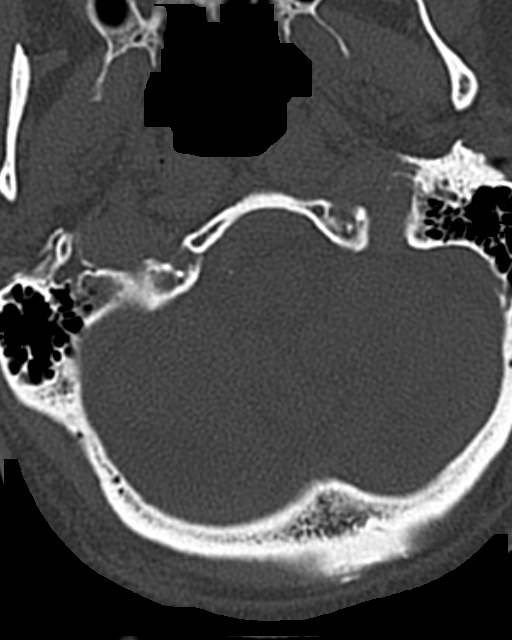

[Series 9: coronal · coronal · 0.32mm/px · 3 of 85 slices shown]
[im 17/85  bone]
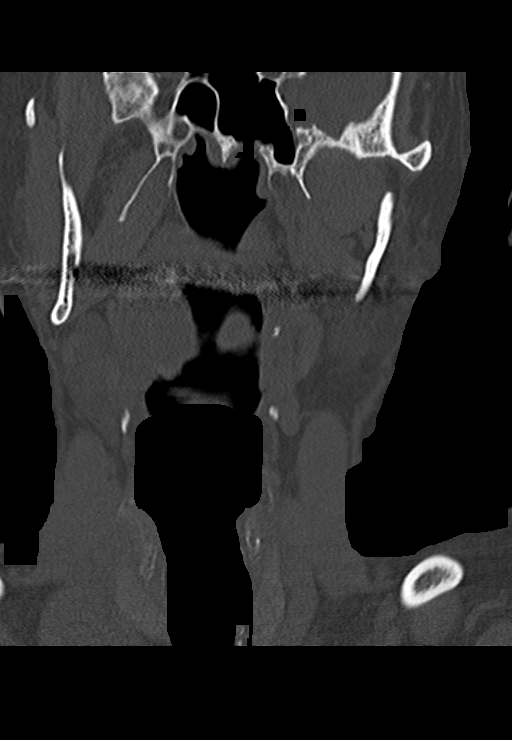
[im 34/85  bone]
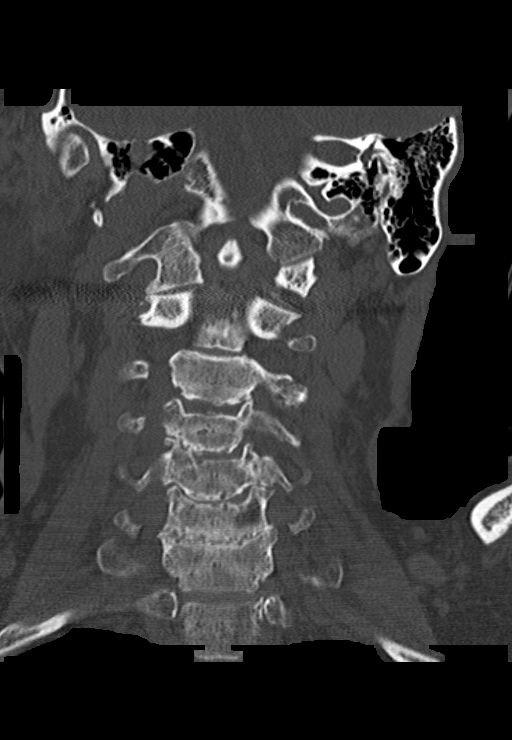
[im 51/85  bone]
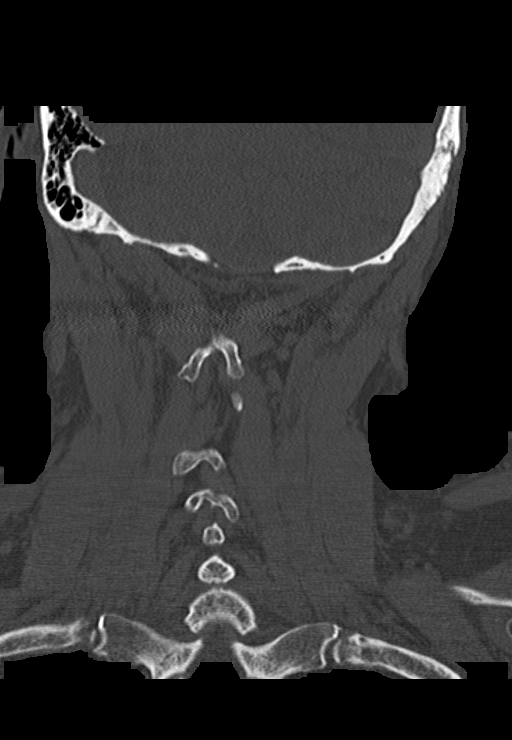

[Series 10: sagittal · sagittal · 0.36mm/px · 5 of 77 slices shown, 6 images]
[im 26/77  bone]
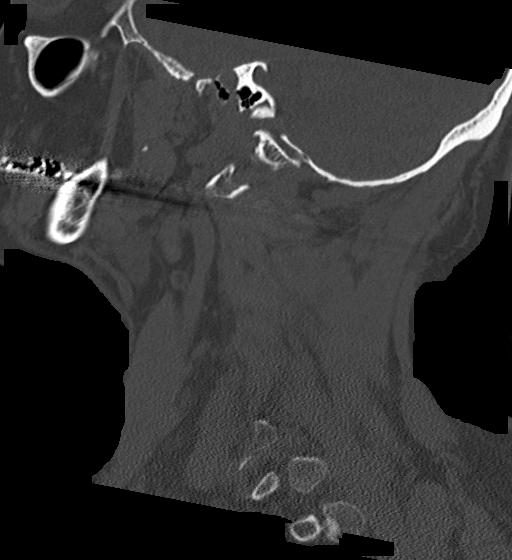
[im 32/77  bone]
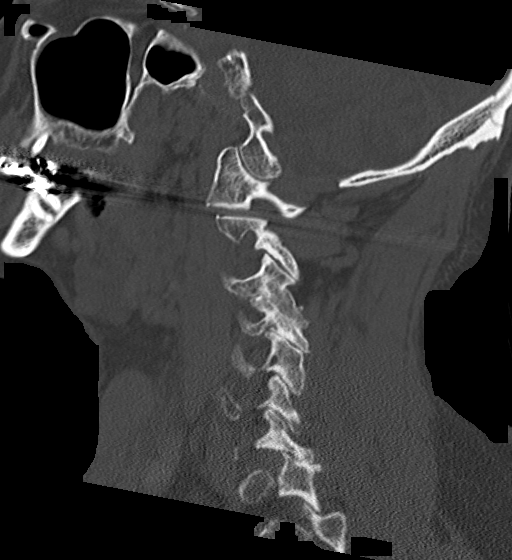
[im 39/77  soft-tissue]
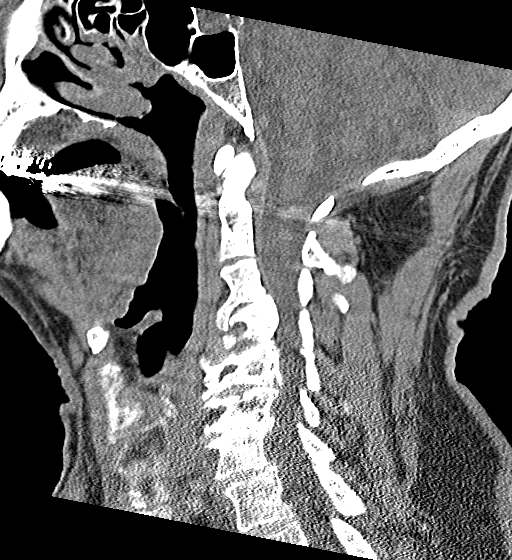
[im 39/77  bone]
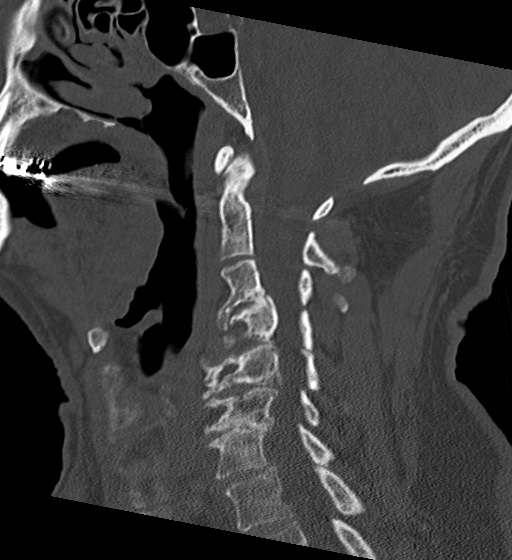
[im 45/77  bone]
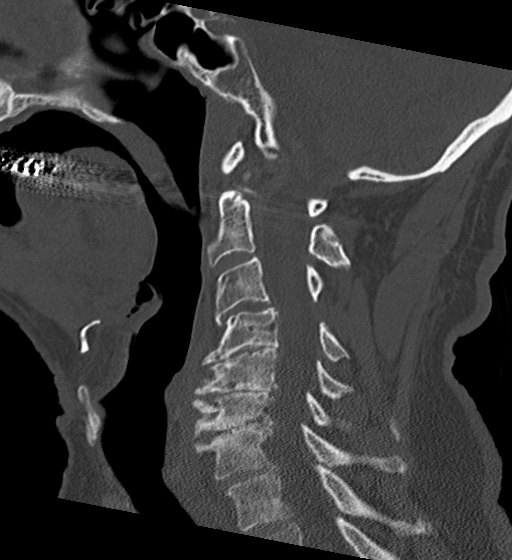
[im 51/77  bone]
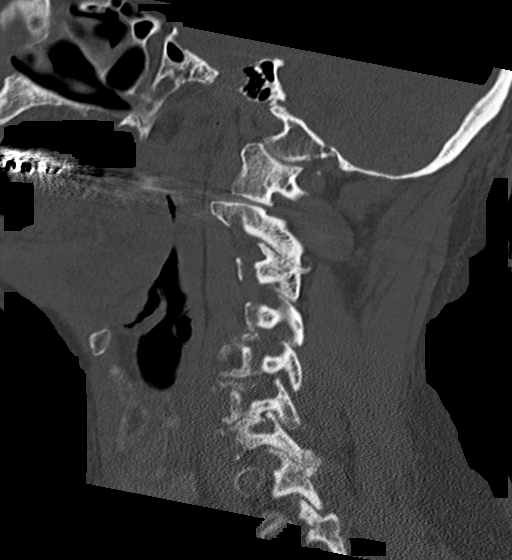

[12 of 33 positions shown; findings below may reference images not displayed]

FINDINGS: CT HEAD FINDINGS

There is no evidence of acute infarction, mass lesion, or intra- or
extra-axial hemorrhage on CT.

Prominence of the ventricles and sulci reflects moderately severe
cortical volume loss. Cerebellar atrophy is noted. A chronic infarct
is seen at the left parietal and temporal lobes, with associated ex
vacuo dilatation of the left lateral ventricle. A chronic lacunar
infarct is noted at the right cerebellar hemisphere. Chronic lacunar
infarcts are seen at the basal ganglia bilaterally. Scattered
periventricular and subcortical white matter change likely reflects
small vessel ischemic microangiopathy.

The brainstem and fourth ventricle are within normal limits. No mass
effect or midline shift is seen.

There is no evidence of fracture; a craniectomy defect is noted
overlying the left temporal lobe. The orbits are within normal
limits. The paranasal sinuses and mastoid air cells are
well-aerated. Mild soft tissue swelling and laceration are seen
overlying the right frontal calvarium.

CT CERVICAL SPINE FINDINGS

There is no evidence of acute fracture or subluxation. Vertebral
bodies demonstrate normal height. There is grade 1 anterolisthesis
of C3 on C4, with underlying left-sided osseous fusion at the
articular facets. Multilevel disc space narrowing and endplate
degenerative change are seen along the lower cervical spine, with
scattered anterior and posterior disc osteophyte complexes.
Prevertebral soft tissues are within normal limits.

The visualized portions of the thyroid gland are unremarkable in
appearance. The minimally visualized lung apices are clear. No
significant soft tissue abnormalities are seen.
IMPRESSION: 1. No evidence of traumatic intracranial injury or fracture.
2. No evidence of acute fracture or subluxation along the cervical
spine.
3. Moderately severe cortical volume loss and scattered small vessel
ischemic microangiopathy.
4. Chronic infarct at the left parietal and temporal lobes, with ex
vacuo dilatation of the left lateral ventricle.
5. Chronic lacunar infarct at the right cerebellar hemisphere, and
chronic lacunar infarcts at the basal ganglia bilaterally.
6. Mild diffuse degenerative change along the lower cervical spine.
7. Mild soft tissue swelling and laceration overlying the right
frontal calvarium.
